# Patient Record
Sex: Male | Born: 1990 | Hispanic: Yes | Marital: Single | Smoking: Never smoker
Health system: Southern US, Community
[De-identification: ages and names within clinical notes are randomized; demographics above are authoritative.]

---
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
## 2015-07-15 ENCOUNTER — Encounter: Payer: Self-pay | Admitting: Emergency Medicine

## 2015-07-15 ENCOUNTER — Emergency Department
Admission: EM | Admit: 2015-07-15 | Discharge: 2015-07-15 | Disposition: A | Discharge status: Home / Self Care | Payer: Managed Care, Other (non HMO) | Attending: Emergency Medicine | Admitting: Emergency Medicine

## 2015-07-15 DIAGNOSIS — E860 Dehydration: Secondary | ICD-10-CM | POA: Insufficient documentation

## 2015-07-15 DIAGNOSIS — R112 Nausea with vomiting, unspecified: Secondary | ICD-10-CM | POA: Insufficient documentation

## 2015-07-15 LAB — COMPREHENSIVE METABOLIC PANEL
ALT: 25 U/L (ref 17–63)
ANION GAP: 10 (ref 5–15)
AST: 27 U/L (ref 15–41)
Albumin: 4.2 g/dL (ref 3.5–5.0)
Alkaline Phosphatase: 67 U/L (ref 38–126)
BILIRUBIN TOTAL: 1.6 mg/dL — AB (ref 0.3–1.2)
BUN: 14 mg/dL (ref 6–20)
CO2: 24 mmol/L (ref 22–32)
Calcium: 8.7 mg/dL — ABNORMAL LOW (ref 8.9–10.3)
Chloride: 104 mmol/L (ref 101–111)
Creatinine, Ser: 1.05 mg/dL (ref 0.61–1.24)
GFR calc Af Amer: >60 mL/min (ref >60)
Glucose, Bld: 127 mg/dL — ABNORMAL HIGH (ref 65–99)
POTASSIUM: 3.7 mmol/L (ref 3.5–5.1)
Sodium: 138 mmol/L (ref 135–145)
TOTAL PROTEIN: 7.9 g/dL (ref 6.5–8.1)

## 2015-07-15 LAB — CBC WITH DIFFERENTIAL/PLATELET
Basophils Absolute: 0 10*3/uL (ref 0–0.1)
Basophils Relative: 0 %
Eosinophils Absolute: 0 10*3/uL (ref 0–0.7)
Eosinophils Relative: 0 %
HEMATOCRIT: 40.2 % (ref 40.0–52.0)
Hemoglobin: 14.1 g/dL (ref 13.0–18.0)
LYMPHS ABS: 0.4 10*3/uL — AB (ref 1.0–3.6)
LYMPHS PCT: 5 %
MCH: 29.8 pg (ref 26.0–34.0)
MCHC: 35.1 g/dL (ref 32.0–36.0)
MCV: 84.7 fL (ref 80.0–100.0)
MONO ABS: 0.8 10*3/uL (ref 0.2–1.0)
MONOS PCT: 10 %
NEUTROS ABS: 6.3 10*3/uL (ref 1.4–6.5)
Neutrophils Relative %: 85 %
Platelets: 140 10*3/uL — ABNORMAL LOW (ref 150–440)
RBC: 4.74 MIL/uL (ref 4.40–5.90)
RDW: 12.4 % (ref 11.5–14.5)
WBC: 7.5 10*3/uL (ref 3.8–10.6)

## 2015-07-15 LAB — LIPASE, BLOOD: LIPASE: 24 U/L (ref 11–51)

## 2015-07-15 LAB — URINALYSIS COMPLETE WITH MICROSCOPIC (ARMC ONLY)
Bacteria, UA: NONE SEEN
Bilirubin Urine: NEGATIVE
GLUCOSE, UA: NEGATIVE mg/dL
Hgb urine dipstick: NEGATIVE
Ketones, ur: NEGATIVE mg/dL
LEUKOCYTES UA: NEGATIVE
Nitrite: NEGATIVE
Protein, ur: 30 mg/dL — AB
SPECIFIC GRAVITY, URINE: 1.035 — AB (ref 1.005–1.030)
pH: 5 (ref 5.0–8.0)

## 2015-07-15 MED ORDER — ONDANSETRON HCL 4 MG/2ML IJ SOLN
4.0000 mg | Freq: Once | INTRAMUSCULAR | Status: AC
  Administered 2015-07-15: 4 mg via INTRAVENOUS
  Filled 2015-07-15: qty 2

## 2015-07-15 MED ORDER — PROMETHAZINE HCL 25 MG PO TABS
25.0000 mg | ORAL_TABLET | Freq: Once | ORAL | Status: AC
  Administered 2015-07-15: 25 mg via ORAL
  Filled 2015-07-15: qty 1

## 2015-07-15 MED ORDER — SODIUM CHLORIDE 0.9 % IV SOLN
Freq: Once | INTRAVENOUS | Status: AC
  Administered 2015-07-15: 10:00:00 via INTRAVENOUS

## 2015-07-15 MED ORDER — SODIUM CHLORIDE 0.9 % IV SOLN
1000.0000 mL | Freq: Once | INTRAVENOUS | Status: AC
  Administered 2015-07-15: 1000 mL via INTRAVENOUS

## 2015-07-15 MED ORDER — PROMETHAZINE HCL 25 MG PO TABS: 25.0000 mg | ORAL_TABLET | Freq: Four times a day (QID) | ORAL | Status: DC | PRN

## 2015-07-15 NOTE — ED Notes (Signed)
Discussed discharge instructions, prescriptions, and follow-up care with patient. No questions or concerns at this time. Pt stable at discharge.  

## 2015-07-15 NOTE — ED Provider Notes (Signed)
[facility] Emergency Department Provider Note     Time seen: ----------------------------------------- 9:47 AM on 07/15/2015 -----------------------------------------    I have reviewed the triage vital signs and the nursing notes.   HISTORY  Chief Complaint Emesis    HPI Dustin Mccann is a 25 y.o. male who presents to the ER for nausea vomiting and weakness for the last 3 days. Patient states he hasn't been able to keep anything down since yesterday, denies any diarrhea. Patient still having profuse vomiting, feels like he may pass out. Nothing has made his symptoms better. Patient is not had this problem before.   History reviewed. No pertinent past medical history.  There are no active problems to display for this patient.   History reviewed. No pertinent past surgical history.  Allergies Review of patient's allergies indicates no known allergies.  Social History Social History  Substance Use Topics  . Smoking status: Never Smoker   . Smokeless tobacco: None  . Alcohol Use: None    Review of Systems Constitutional: Negative for fever. Eyes: Negative for visual changes. ENT: Negative for sore throat. Cardiovascular: Negative for chest pain. Respiratory: Negative for shortness of breath. Gastrointestinal: Positive for nausea and vomiting, negative for abdominal pain Genitourinary: Negative for dysuria. Musculoskeletal: Negative for back pain. Skin: Negative for rash. Neurological: Negative for headaches, positive for weakness  10-point ROS otherwise negative.  ____________________________________________   PHYSICAL EXAM:  VITAL SIGNS: ED Triage Vitals  Enc Vitals Group     BP 07/15/15 0935 100/39 mmHg     Pulse Rate 07/15/15 0935 135     Resp 07/15/15 0935 20     Temp 07/15/15 0935 98.9 F (37.2 C)     Temp src --      SpO2 07/15/15 0935 97 %     Weight 07/15/15 0935 300 lb (136.079 kg)     Height 07/15/15 0935 5'  8" (1.727 m)     Head Cir --      Peak Flow --      Pain Score --      Pain Loc --      Pain Edu? --      Excl. in GC? --     Constitutional: Alert and oriented. Mild distress Eyes: Conjunctivae are normal. PERRL. Normal extraocular movements. ENT   Head: Normocephalic and atraumatic.   Nose: No congestion/rhinnorhea.   Mouth/Throat: Mucous membranes are moist.   Neck: No stridor. Cardiovascular: Normal rate, regular rhythm. Normal and symmetric distal pulses are present in all extremities. No murmurs, rubs, or gallops. Respiratory: Normal respiratory effort without tachypnea nor retractions. Breath sounds are clear and equal bilaterally. No wheezes/rales/rhonchi. Gastrointestinal: Soft and nontender. No distention. No abdominal bruits.  Musculoskeletal: Nontender with normal range of motion in all extremities. No joint effusions.  No lower extremity tenderness nor edema. Neurologic:  Normal speech and language. No gross focal neurologic deficits are appreciated.  Skin:  Skin is warm, dry and intact. No rash noted. Psychiatric: Mood and affect are normal. Speech and behavior are normal. Patient exhibits appropriate insight and judgment. ____________________________________________  ED COURSE:  Pertinent labs & imaging results that were available during my care of the patient were reviewed by me and considered in my medical decision making (see chart for details). Patient likely with Norovirus and dehydration. He will receive 2 L of saline and IV antiemetics ____________________________________________    LABS (pertinent positives/negatives)  Labs Reviewed  CBC WITH DIFFERENTIAL/PLATELET - Abnormal; Notable for the following:  Platelets 140 (*)    Lymphs Abs 0.4 (*)    All other components within normal limits  COMPREHENSIVE METABOLIC PANEL - Abnormal; Notable for the following:    Glucose, Bld 127 (*)    Calcium 8.7 (*)    Total Bilirubin 1.6 (*)    All other  components within normal limits  URINALYSIS COMPLETEWITH MICROSCOPIC ([facility] ONLY) - Abnormal; Notable for the following:    Color, Urine YELLOW (*)    APPearance CLEAR (*)    Specific Gravity, Urine 1.035 (*)    Protein, ur 30 (*)    Squamous Epithelial / LPF 0-5 (*)    All other components within normal limits  LIPASE, BLOOD   ____________________________________________  FINAL ASSESSMENT AND PLAN  Vomiting, dehydration  Plan: Patient with labs as dictated above. Patient was able to tolerate liquids by mouth here. He can continue oral hydration at home. He'll be discharged with Zofran. Labs are unremarkable.   Emily Filbert, MD   Emily Filbert, MD 07/15/15 [phone]

## 2015-07-15 NOTE — ED Notes (Signed)
Report given to Alicia, RN

## 2015-07-15 NOTE — ED Notes (Signed)
N/v and weakness x 3 days.  Pt pale on arrival. Transported to room 15.

## 2015-07-15 NOTE — Discharge Instructions (Signed)
Nuseas y Vmitos (Nausea and Vomiting) La nusea es la sensacin de Tree surgeon en el estmago o de la necesidad de vomitar. El vmito es un reflejo por el que los contenidos del estmago salen por la boca. El vmito puede ocasionar prdida de lquidos del organismo (deshidratacin). Los nios y los [employer] pueden deshidratarse rpidamente (en especial si tambin tienen diarrea). Las nuseas y los vmitos son sntoma de un trastorno o enfermedad. Es importante Energy manager causa de los sntomas. CAUSAS  Irritacin directa de la membrana que cubre el Carmine. Esta irritacin puede ser resultado del aumento de la produccin de cido, (reflujo gastroesofgico), infecciones, intoxicacin alimentaria, ciertos medicamentos (como antinflamatorios no esteroideos), consumo de alcohol o de tabaco.  Seales del cerebro.Estas seales pueden ser un dolor de cabeza, exposicin al calor, trastornos del odo interno, aumento de la presin en el cerebro por lesiones, infeccin, un tumor o conmocin cerebral, estmulos emocionales o problemas metablicos.  Una obstruccin en el tracto gastrointestinal (obstruccin intestinal).  Ciertas enfermedades como la diabetes, problemas en la vescula biliar, apendicitis, problemas renales, cncer, sepsis, sntomas atpicos de infarto o trastornos alimentarios.  Tratamientos mdicos como la quimioterapia y la radiacin.  Medicamentos que inducen al sueo (anestesia general) durante Clementeen Hoof. DIAGNSTICO  El mdico podr solicitarle algunos anlisis si los problemas no mejoran luego de algunos das. Tambin podrn pedirle anlisis si los sntomas son graves o si el motivo de los vmitos o las nuseas no est claro. Los [employer] ser:   Anlisis de [place].  Anlisis de [place].  Pruebas de materia fecal.  Cultivos (para buscar evidencias de infeccin).  Radiografas u otros estudios por imgenes. Los [employer] de las pruebas lo ayudarn al mdico a  tomar decisiones acerca del mejor curso de tratamiento o la necesidad de [employer].  TRATAMIENTO  Debe estar bien hidratado. Beba con frecuencia pequeas cantidades de lquido.Puede beber agua, bebidas deportivas, caldos claros o comer pequeos trocitos de hielo o gelatina para mantenerse hidratado.Cuando coma, hgalo lentamente para evitar las nuseas.Hay medicamentos para evitar las nuseas que pueden aliviarlo.  INSTRUCCIONES PARA EL CUIDADO DOMICILIARIO  Si su mdico le prescribe medicamentos tmelos como se le haya indicado.  Si no tiene hambre, no se fuerce a comer. Sin embargo, es necesario que tome lquidos.  Si tiene hambre alimntese con una dieta normal, a menos que el mdico le indique otra cosa.  Los mejores alimentos son Ardelia Mems combinacin de carbohidratos complejos (arroz, trigo, papas, pan), carnes magras, yogur, frutas y Photographer.  Evite los alimentos ricos en grasas porque dificultan la digestin.  Beba gran cantidad de lquido para mantener la orina de tono claro o color amarillo plido.  Si est deshidratado, consulte a su mdico para que le d instrucciones especficas para volver a hidratarlo. Los signos de deshidratacin son:  Doristine Section sed.  Labios y boca secos.  Mareos.  Elmon Else.  Disminucin de la frecuencia y cantidad de la [place].  Confusin.  Tiene el pulso o la respiracin acelerados. SOLICITE ATENCIN MDICA DE INMEDIATO SI:  Vomita sangre o algo similar a la borra del caf.  La materia fecal (heces) es negra o tiene [address].  Sufre una cefalea grave o rigidez en el cuello.  Se siente confundido.  Siente dolor abdominal intenso.  Tiene dolor en el pecho o dificultad para respirar.  No orina por 8 horas.  Tiene la piel fra y pegajosa.  Sigue vomitando durante ms de 24 a 48 horas.  Tiene fiebre. ASEGRESE QUE:   Comprende  estas instrucciones.  Controlar su enfermedad.  Solicitar ayuda inmediatamente si no mejora o  si empeora.   Esta informacin no tiene Theme park manager el consejo del mdico. Asegrese de hacerle al mdico cualquier pregunta que tenga.   Document Released: 06/02/2007 Document Revised: 08/05/2011 Elsevier Interactive Patient Education 2016 [employer].  Deshidratacin en los adultos (Dehydration, Adult) La deshidratacin significa que el organismo no tiene todo el lquido o el agua que necesita. Se produce cuando se toma menos lquido del que se pierde. Los riones, el cerebro y el corazn no funcionarn correctamente sin la cantidad [place] de lquido.  La deshidratacin puede ser leve o grave. Debe tratarse de inmediato para evitar que se agrave. CUIDADOS EN EL HOGAR  Beba suficiente lquido para mantener el pis (orina) claro o de color amarillo plido.  Beba lentamente pequeos sorbos de agua o de lquido. Tambin puede chupar cubos de hielo.  Consuma alimentos o bebidas que contengan electrolitos. Como por ejemplo, bananas y bebidas deportivas.  Tome los medicamentos de venta libre y los recetados solamente como se lo haya indicado el mdico.  Prepare la solucin de rehidratacin oral (SRO) de acuerdo con las indicaciones del producto. Tome sorbos de la SRO cada hasta que la orina se normalice.  Si vomita o la materia fecal es lquida (diarrea), siga intentando tomar agua, SRO o las [employer].  Si la materia fecal es lquida, evite lo siguiente:  Las bebidas con cafena.  El jugo de frutas.  [employer].  Las bebidas gaseosas.  No tome comprimidos de sal. Esto puede aumentar la concentracin de sodio en el organismo (hipernatremia). SOLICITE AYUDA SI:  No puede comer o tomar lquido sin vomitar.  La materia fecal ha sido levemente acuosa durante ms de 24horas.  Tiene fiebre. SOLICITE AYUDA DE INMEDIATO SI:   Tiene mucha sed.  La materia fecal es [place].  No ha orinado durante 6 a 8horas o solo ha orinado una cantidad [employer].  Tiene la piel arrugada.  Est mareado, confundido o tiene ambos sntomas.   Esta informacin no tiene Theme park manager el consejo del mdico. Asegrese de hacerle al mdico cualquier pregunta que tenga.   Document Released: 06/15/2010 Document Revised: 02/01/2015 Elsevier Interactive Patient Education [employer]

## 2016-04-25 ENCOUNTER — Other Ambulatory Visit
Admission: RE | Admit: 2016-04-25 | Discharge: 2016-04-25 | Disposition: A | Discharge status: Home / Self Care | Payer: Managed Care, Other (non HMO) | Source: Ambulatory Visit | Attending: Internal Medicine | Admitting: Internal Medicine

## 2016-04-25 DIAGNOSIS — R42 Dizziness and giddiness: Secondary | ICD-10-CM | POA: Insufficient documentation

## 2016-04-25 DIAGNOSIS — B9681 Helicobacter pylori [H. pylori] as the cause of diseases classified elsewhere: Secondary | ICD-10-CM | POA: Insufficient documentation

## 2016-04-25 DIAGNOSIS — E669 Obesity, unspecified: Secondary | ICD-10-CM | POA: Insufficient documentation

## 2016-04-25 LAB — TSH: TSH: 3.231 u[IU]/mL (ref 0.350–4.500)

## 2016-04-25 LAB — CBC
HCT: 46.1 % (ref 40.0–52.0)
Hemoglobin: 15.5 g/dL (ref 13.0–18.0)
MCH: 28.8 pg (ref 26.0–34.0)
MCHC: 33.7 g/dL (ref 32.0–36.0)
MCV: 85.7 fL (ref 80.0–100.0)
Platelets: 204 10*3/uL (ref 150–440)
RBC: 5.39 MIL/uL (ref 4.40–5.90)
RDW: 12.9 % (ref 11.5–14.5)
WBC: 8.6 10*3/uL (ref 3.8–10.6)

## 2016-04-25 LAB — BASIC METABOLIC PANEL
Anion gap: 7 (ref 5–15)
BUN: 14 mg/dL (ref 6–20)
CO2: 28 mmol/L (ref 22–32)
Calcium: 9.7 mg/dL (ref 8.9–10.3)
Chloride: 103 mmol/L (ref 101–111)
Creatinine, Ser: 0.99 mg/dL (ref 0.61–1.24)
GFR calc Af Amer: >60 mL/min (ref >60)
GFR calc non Af Amer: >60 mL/min (ref >60)
Glucose, Bld: 92 mg/dL (ref 65–99)
Potassium: 4.1 mmol/L (ref 3.5–5.1)
Sodium: 138 mmol/L (ref 135–145)

## 2016-04-25 LAB — LIPID PANEL
Cholesterol: 226 mg/dL — ABNORMAL HIGH (ref 0–200)
HDL: 38 mg/dL — ABNORMAL LOW (ref >40)
LDL Cholesterol: 157 mg/dL — ABNORMAL HIGH (ref 0–99)
Total CHOL/HDL Ratio: 5.9 RATIO
Triglycerides: 154 mg/dL — ABNORMAL HIGH (ref <150)
VLDL: 31 mg/dL (ref 0–40)

## 2016-04-26 LAB — H PYLORI, IGM, IGG, IGA AB
H Pylori IgG: 4.7 U/mL — ABNORMAL HIGH (ref 0.0–0.8)
H. Pylogi, Iga Abs: <9 units (ref 0.0–8.9)
H. Pylogi, Igm Abs: <9 units (ref 0.0–8.9)

## 2018-11-12 ENCOUNTER — Emergency Department
Admission: EM | Admit: 2018-11-12 | Discharge: 2018-11-12 | Disposition: A | Discharge status: Other Acute Inpatient Hospital | Payer: Commercial Managed Care - PPO | Attending: Emergency Medicine | Admitting: Emergency Medicine

## 2018-11-12 ENCOUNTER — Inpatient Hospital Stay (HOSPITAL_COMMUNITY)
Admission: AD | Admit: 2018-11-12 | Discharge: 2018-11-17 | DRG: 177 | Disposition: A | Discharge status: Home / Self Care | Payer: Commercial Managed Care - PPO | Source: Other Acute Inpatient Hospital | Attending: Family Medicine | Admitting: Family Medicine

## 2018-11-12 ENCOUNTER — Inpatient Hospital Stay (HOSPITAL_COMMUNITY): Payer: Commercial Managed Care - PPO

## 2018-11-12 ENCOUNTER — Other Ambulatory Visit: Payer: Self-pay

## 2018-11-12 ENCOUNTER — Encounter: Payer: Self-pay | Admitting: Emergency Medicine

## 2018-11-12 ENCOUNTER — Emergency Department: Payer: Commercial Managed Care - PPO

## 2018-11-12 ENCOUNTER — Encounter (HOSPITAL_COMMUNITY): Payer: Self-pay | Admitting: Family Medicine

## 2018-11-12 DIAGNOSIS — E669 Obesity, unspecified: Secondary | ICD-10-CM | POA: Diagnosis not present

## 2018-11-12 DIAGNOSIS — E876 Hypokalemia: Secondary | ICD-10-CM | POA: Diagnosis present

## 2018-11-12 DIAGNOSIS — J9601 Acute respiratory failure with hypoxia: Secondary | ICD-10-CM | POA: Diagnosis present

## 2018-11-12 DIAGNOSIS — Z6841 Body Mass Index (BMI) 40.0 and over, adult: Secondary | ICD-10-CM

## 2018-11-12 DIAGNOSIS — J1289 Other viral pneumonia: Secondary | ICD-10-CM | POA: Diagnosis present

## 2018-11-12 DIAGNOSIS — Z833 Family history of diabetes mellitus: Secondary | ICD-10-CM

## 2018-11-12 DIAGNOSIS — J1282 Pneumonia due to coronavirus disease 2019: Secondary | ICD-10-CM | POA: Diagnosis present

## 2018-11-12 DIAGNOSIS — R740 Nonspecific elevation of levels of transaminase and lactic acid dehydrogenase [LDH]: Secondary | ICD-10-CM

## 2018-11-12 DIAGNOSIS — U071 COVID-19: Secondary | ICD-10-CM | POA: Diagnosis present

## 2018-11-12 DIAGNOSIS — R0602 Shortness of breath: Secondary | ICD-10-CM | POA: Diagnosis present

## 2018-11-12 LAB — CBC WITH DIFFERENTIAL/PLATELET
Abs Immature Granulocytes: 0.02 10*3/uL (ref 0.00–0.07)
Basophils Absolute: 0 10*3/uL (ref 0.0–0.1)
Basophils Relative: 0 %
Eosinophils Absolute: 0 10*3/uL (ref 0.0–0.5)
Eosinophils Relative: 0 %
HCT: 44.4 % (ref 39.0–52.0)
Hemoglobin: 15.3 g/dL (ref 13.0–17.0)
Immature Granulocytes: 1 %
Lymphocytes Relative: 12 %
Lymphs Abs: 0.5 10*3/uL — ABNORMAL LOW (ref 0.7–4.0)
MCH: 28.5 pg (ref 26.0–34.0)
MCHC: 34.5 g/dL (ref 30.0–36.0)
MCV: 82.7 fL (ref 80.0–100.0)
Monocytes Absolute: 0.4 10*3/uL (ref 0.1–1.0)
Monocytes Relative: 9 %
Neutro Abs: 3.5 10*3/uL (ref 1.7–7.7)
Neutrophils Relative %: 78 %
Platelets: 145 10*3/uL — ABNORMAL LOW (ref 150–400)
RBC: 5.37 MIL/uL (ref 4.22–5.81)
RDW: 12 % (ref 11.5–15.5)
WBC: 4.4 10*3/uL (ref 4.0–10.5)
nRBC: 0 % (ref 0.0–0.2)

## 2018-11-12 LAB — SARS CORONAVIRUS 2 BY RT PCR (HOSPITAL ORDER, PERFORMED IN ~~LOC~~ HOSPITAL LAB): SARS Coronavirus 2: POSITIVE — AB

## 2018-11-12 LAB — COMPREHENSIVE METABOLIC PANEL
ALT: 54 U/L — ABNORMAL HIGH (ref 0–44)
AST: 52 U/L — ABNORMAL HIGH (ref 15–41)
Albumin: 3.7 g/dL (ref 3.5–5.0)
Alkaline Phosphatase: 55 U/L (ref 38–126)
Anion gap: 14 (ref 5–15)
BUN: 13 mg/dL (ref 6–20)
CO2: 21 mmol/L — ABNORMAL LOW (ref 22–32)
Calcium: 8.6 mg/dL — ABNORMAL LOW (ref 8.9–10.3)
Chloride: 100 mmol/L (ref 98–111)
Creatinine, Ser: 0.81 mg/dL (ref 0.61–1.24)
GFR calc Af Amer: >60 mL/min (ref >60)
GFR calc non Af Amer: >60 mL/min (ref >60)
Glucose, Bld: 146 mg/dL — ABNORMAL HIGH (ref 70–99)
Potassium: 3.2 mmol/L — ABNORMAL LOW (ref 3.5–5.1)
Sodium: 135 mmol/L (ref 135–145)
Total Bilirubin: 1 mg/dL (ref 0.3–1.2)
Total Protein: 8.3 g/dL — ABNORMAL HIGH (ref 6.5–8.1)

## 2018-11-12 LAB — PROCALCITONIN: Procalcitonin: <0.1 ng/mL

## 2018-11-12 LAB — FIBRIN DERIVATIVES D-DIMER (ARMC ONLY): Fibrin derivatives D-dimer (ARMC): 743.2 ng/mL (FEU) — ABNORMAL HIGH (ref 0.00–499.00)

## 2018-11-12 LAB — FIBRINOGEN: Fibrinogen: 660 mg/dL — ABNORMAL HIGH (ref 210–475)

## 2018-11-12 LAB — LACTIC ACID, PLASMA: Lactic Acid, Venous: 1.4 mmol/L (ref 0.5–1.9)

## 2018-11-12 LAB — C-REACTIVE PROTEIN: CRP: 9.7 mg/dL — ABNORMAL HIGH (ref <1.0)

## 2018-11-12 LAB — TRIGLYCERIDES: Triglycerides: 114 mg/dL (ref <150)

## 2018-11-12 LAB — LACTATE DEHYDROGENASE: LDH: 260 U/L — ABNORMAL HIGH (ref 98–192)

## 2018-11-12 LAB — FERRITIN: Ferritin: 738 ng/mL — ABNORMAL HIGH (ref 24–336)

## 2018-11-12 MED ORDER — KETOROLAC TROMETHAMINE 30 MG/ML IJ SOLN
INTRAMUSCULAR | Status: AC
  Filled 2018-11-12: qty 1

## 2018-11-12 MED ORDER — POTASSIUM CHLORIDE CRYS ER 20 MEQ PO TBCR
40.0000 meq | EXTENDED_RELEASE_TABLET | Freq: Once | ORAL | Status: AC
  Administered 2018-11-12: 40 meq via ORAL
  Filled 2018-11-12: qty 2

## 2018-11-12 MED ORDER — ACETAMINOPHEN 325 MG PO TABS
ORAL_TABLET | ORAL | Status: AC
  Administered 2018-11-12: 650 mg via ORAL
  Filled 2018-11-12: qty 2

## 2018-11-12 MED ORDER — LOPERAMIDE HCL 2 MG PO CAPS
2.0000 mg | ORAL_CAPSULE | Freq: Four times a day (QID) | ORAL | Status: DC | PRN
  Administered 2018-11-13 (×2): 2 mg via ORAL
  Filled 2018-11-12 (×2): qty 1

## 2018-11-12 MED ORDER — ACETAMINOPHEN 325 MG PO TABS
650.0000 mg | ORAL_TABLET | Freq: Four times a day (QID) | ORAL | Status: DC | PRN
  Administered 2018-11-12: 650 mg via ORAL
  Filled 2018-11-12: qty 2

## 2018-11-12 MED ORDER — SODIUM CHLORIDE 0.9 % IV SOLN
500.0000 mg | Freq: Once | INTRAVENOUS | Status: AC
  Administered 2018-11-12: 08:00:00 500 mg via INTRAVENOUS
  Filled 2018-11-12: qty 500

## 2018-11-12 MED ORDER — ACETAMINOPHEN 325 MG PO TABS
650.0000 mg | ORAL_TABLET | Freq: Once | ORAL | Status: AC
  Administered 2018-11-12: 08:00:00 650 mg via ORAL

## 2018-11-12 MED ORDER — MEDI-TUSSIN DM DOUBLE STRENGTH 30-200 MG/5ML PO LIQD
1000.00 | ORAL | Status: DC
Start: 2018-11-10 — End: 2018-11-12

## 2018-11-12 MED ORDER — ISOVUE-M 300 61 % IJ SOLN
4.00 | INTRAMUSCULAR | Status: DC
Start: ? — End: 2018-11-12

## 2018-11-12 MED ORDER — VITAMIN C 500 MG PO TABS
500.0000 mg | ORAL_TABLET | Freq: Every day | ORAL | Status: DC
  Administered 2018-11-12 – 2018-11-17 (×6): 500 mg via ORAL
  Filled 2018-11-12 (×6): qty 1

## 2018-11-12 MED ORDER — DEXAMETHASONE SODIUM PHOSPHATE 10 MG/ML IJ SOLN
10.0000 mg | Freq: Once | INTRAMUSCULAR | Status: AC
  Administered 2018-11-12: 07:00:00 10 mg via INTRAVENOUS
  Filled 2018-11-12: qty 1

## 2018-11-12 MED ORDER — IPRATROPIUM-ALBUTEROL 0.5-2.5 (3) MG/3ML IN SOLN
RESPIRATORY_TRACT | Status: AC
  Filled 2018-11-12: qty 3

## 2018-11-12 MED ORDER — ZINC SULFATE 220 (50 ZN) MG PO CAPS
220.0000 mg | ORAL_CAPSULE | Freq: Every day | ORAL | Status: DC
  Administered 2018-11-12 – 2018-11-17 (×6): 220 mg via ORAL
  Filled 2018-11-12 (×6): qty 1

## 2018-11-12 MED ORDER — SELECT BRAND INSULIN SYRINGE 29G X 1/2" 1 ML MISC
1.00 | Status: DC
Start: 2018-11-11 — End: 2018-11-12

## 2018-11-12 MED ORDER — DEXAMETHASONE 6 MG PO TABS
6.0000 mg | ORAL_TABLET | Freq: Every day | ORAL | Status: DC
  Administered 2018-11-13 – 2018-11-17 (×5): 6 mg via ORAL
  Filled 2018-11-12 (×5): qty 1

## 2018-11-12 MED ORDER — GUAIFENESIN-DM 100-10 MG/5ML PO SYRP
10.0000 mL | ORAL_SOLUTION | ORAL | Status: DC | PRN
  Administered 2018-11-13: 10 mL via ORAL
  Filled 2018-11-12: qty 10

## 2018-11-12 MED ORDER — ENOXAPARIN SODIUM 80 MG/0.8ML ~~LOC~~ SOLN
70.0000 mg | SUBCUTANEOUS | Status: DC
  Administered 2018-11-12 – 2018-11-16 (×5): 70 mg via SUBCUTANEOUS
  Filled 2018-11-12 (×5): qty 0.8

## 2018-11-12 MED ORDER — IOHEXOL 350 MG/ML SOLN
100.0000 mL | Freq: Once | INTRAVENOUS | Status: AC | PRN
  Administered 2018-11-12: 100 mL via INTRAVENOUS

## 2018-11-12 MED ORDER — SODIUM CHLORIDE 0.9 % IV SOLN
1.0000 g | Freq: Once | INTRAVENOUS | Status: AC
  Administered 2018-11-12: 08:00:00 1 g via INTRAVENOUS
  Filled 2018-11-12: qty 10

## 2018-11-12 MED ORDER — KETOROLAC TROMETHAMINE 30 MG/ML IJ SOLN
15.0000 mg | Freq: Once | INTRAMUSCULAR | Status: AC
  Administered 2018-11-12: 15 mg via INTRAVENOUS

## 2018-11-12 NOTE — ED Provider Notes (Signed)
Spoke with patient regarding test results in the recommendation to be transferred to our [place] [facility] [place] care, patient has agreed to this.  Discussed with Dr. Loleta Books who has accepted the patient in transfer   Lavonia Drafts, MD 11/12/18 [phone]

## 2018-11-12 NOTE — Plan of Care (Signed)

## 2018-11-12 NOTE — ED Triage Notes (Signed)
Patient ambulatory to triage with steady gait, without difficulty or distress noted, mask in place; pt reports +COVID on Monday at [facility]; having increased SHOB, mid CP and prod cough bloody sputum; pt taken immed to room 6 via w/c by EDT Alma Friendly for further eval; charge nurse notified

## 2018-11-12 NOTE — Progress Notes (Signed)
Dr. Curly Rim because patient asking for medication for diarrhea. See new orders.

## 2018-11-12 NOTE — ED Notes (Signed)
Pt states he was at [facility] for one night and then discharged. Pt says tonight he was unable to sleep due to worsening SOB. Pt respiratory rate is 33. No cough heard. Pt states he does not feel good. Dr Owens Shark at the bedside.

## 2018-11-12 NOTE — H&P (Signed)
History and Physical  Patient Name: Dustin Mccann     SWN:[id number]    DOB: 23-Mar-1991    DOA: 11/12/2018 PCP: [facility]  Patient coming from: [facility] ER  Chief Complaint: Dyspnea with exertion      HPI: Dustin Mccann is a 28 y.o. M with hx obesity who presents with 1 week cough, fever, now progressive dyspnea on exertion, dizziness, hemoptysis.  The patient was at usual state of health until 6/9 when he developed cough, fever, body aches.  His mother had developed symptoms around the same time.  On Mon 3 days PTA, he went to [facility] ER where he tested positive for SARS-CoV-2, had developing pneumonia on chest x-ray, but was stable on room air and was discharged the next day.    Over the next 3 days, he is started to feel more dyspneic with exertion, chest discomfort, and dizziness with ambulation.  He has had continued fevers and cough productive of once bloody sputum.   At [facility], he had bilateral pneumonia on chest x-ray, was hypoxic to 87% on room air.  Had lymphopenia and transaminitis.  Had newly elevated d-dimer.  Lactate, renal function, procalcitonin all normal.  He was given Decadron and transferred to [facility].        ROS: Review of Systems  Constitutional: Positive for chills, fever and malaise/fatigue.  Respiratory: Positive for cough, hemoptysis and shortness of breath. Negative for sputum production and wheezing.   Cardiovascular: Positive for chest pain. Negative for palpitations, orthopnea, claudication, leg swelling and PND.  Gastrointestinal: Positive for diarrhea. Negative for abdominal pain, nausea and vomiting.  Musculoskeletal: Positive for myalgias.  All other systems reviewed and are negative.       Allergies: @ALLERGY @  Home medications: None  Past medical history: Obesity  Past surgical history: Buttock abscess I&D  Family history:  Grandfather, diabetes  Social History:  Patient lives with  mother, siblings.  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  Non-smoker.  Minimal alcohol.      Physical Exam: BP 119/76 (BP Location: Left Arm)   Pulse (!) 103   Temp 97.8 F (36.6 C) (Oral)   SpO2 96%  General appearance: Well-developed, adult male, alert and in mild respiratory distress.   Eyes: Anicteric, conjunctiva pink, lids and lashes normal. PERRL.    ENT: No nasal deformity, discharge, epistaxis.  Hearing normal. OP moist without lesions.   Skin: Warm and dry.  No jaundice.  No suspicious rashes or lesions. Cardiac: RRR, nl S1-S2, no murmurs appreciated.  Capillary refill is brisk.  JVP not visible.  No LE edema.  Radial pulses 2+ and symmetric. Respiratory: Tachypneic, shallow, lung sounds diminished bilaterally, no wheezes. Abdomen: Abdomen soft.  No TTP or guarding. No ascites, distension, hepatosplenomegaly.   MSK: No deformities or effusions of the large joints of the upper or lower extremities bilaterally.  No cyanosis or clubbing. Neuro: Cranial nerves 3 through 12 intact.  Sensation intact to light touch. Speech is fluent.  Muscle strength symmetric bilateral upper and lower extremities.    Psych: Sensorium intact and responding to questions, attention normal.  Behavior appropriate.  Affect normal.  Judgment and insight appear normal.       Labs on Admission:  The metabolic panel personally reviewed shows mild transaminitis, hypokalemia, normal renal function. The complete blood count shows lymphopenia, mild thrombocytopenia. Hepatic panel shows mild transaminitis. Ferritin level elevated, d-dimer elevated.  Radiological Exams on Admission: Personally reviewed chest x-ray  shows bilateral opacities.         Assessment/Plan  Acute hypoxic respiratory failure COVID-19 New, severe.  - Continue Decadron - Lovenox for VTE prophylaxis - Zinc and vitamin C - Obtain CTA chest to rule out PE  -Trend d-dimer, ferritin, CRP -If O2 needs  normalize by tomorrow, likely home within 2-3 days, otherwise will start remdesivir    Hypokalemia -Supplement K -Trend BMP  Transaminitis New -Check hepatitis serologies -Check HIV -Trend LFTs   DVT prophylaxis: Loveno  Code Status: FULL  Family Communication: None present Disposition Plan: Anticipate close monitoring of O2 needs overnight.  If O2 needs persistent or worsening, will start remdesivir Consults called: None Admission status: INPATIENT   At the time of admission, it appears that the appropriate admission status for this patient is INPATIENT. This is judged to be reasonable and necessary in order to provide the required intensity of service to ensure the patient's safety given: -presenting symptoms of dyspnea, dizziness, chest discomfort, near syncope -physical exam findings of tachypnea, diminished lung sounds, tachycardia, and  -initial radiographic and laboratory data bilateral peumonia, SARS-CoV-2 infection, elevated d-dimer, elevated ferritin, lymphopenia, transaminitis -in the context of their chronic comorbidities obesity    Together, these circumstances are felt to place him at high risk for further clinical deterioration threatening life, limb, or organ requiring a high intensity of service due to this acute illness that poses a threat to life, limb or bodily function.  I certify that at the point of admission it is my clinical judgment that the patient will require inpatient hospital care spanning beyond 2 midnights from the point of admission and that early discharge would result in unnecessary risk of decompensation and readmission or threat to life, limb or bodily function.      Medical decision making: Patient seen at 6:09 PM on 11/12/2018.  The patient was discussed with Dr. Cyril LoosenKinner.  What exists of the patient's chart was reviewed in depth and summarized above.  Clinical condition: requiring 3L supplemental O2, appears out of breath but hemodynamically  stable.      Earl Liteshristopher P Ignace Mandigo [facility] Pager: please page via [email or web address]

## 2018-11-12 NOTE — ED Notes (Signed)
Handoff given to [employer]

## 2018-11-12 NOTE — ED Notes (Signed)
[facility]  CALLED  FOR  TRANSFER 

## 2018-11-12 NOTE — ED Notes (Signed)
Pt placed on 2L of O2 via nasal cannula. 

## 2018-11-12 NOTE — ED Provider Notes (Signed)
[facility] Emergency Department Provider Note _______   First MD Initiated Contact with Patient 11/12/18 [phone]     (approximate)  I have reviewed the triage vital signs and the nursing notes.   HISTORY  Chief Complaint Shortness of Breath    HPI Dustin CanterDaniel Sosa Mccann is a 28 y.o. male with below list of previous medical additions including obesity and recently diagnosed COVID-19 infection at [facility] on Monday which he was hospitalized for 1 day per patient presents to the emergency department secondary to increasing dyspnea mid chest discomfort and productive cough with hemoptysis.  Patient also admits to fever and chills.  Past medical history COVID-19 infection        Patient Active Problem List   Diagnosis Date Noted   COVID-19 virus detected 11/12/2018   Acute respiratory failure with hypoxemia (HCC) 11/12/2018   Pneumonia due to COVID-19 virus 11/12/2018    History reviewed. No pertinent surgical history.  Prior to Admission medications   Not on File    Allergies No known drug allergies Family History  Problem Relation Age of Onset   Diabetes Maternal Grandfather     Social History Social History   Tobacco Use   Smoking status: Never Smoker   Smokeless tobacco: Never Used  Substance Use Topics   Alcohol use: Not Currently   Drug use: Never    Review of Systems Constitutional: Positive for fever and chills Eyes: No visual changes. ENT: No sore throat. Cardiovascular: Denies chest pain. Respiratory: Positive for dyspnea scant hemoptysis and cough Gastrointestinal: No abdominal pain.  No nausea, no vomiting.  No diarrhea.  No constipation. Genitourinary: Negative for dysuria. Musculoskeletal: Negative for neck pain.  Negative for back pain. Integumentary: Negative for rash. Neurological: Negative for headaches, focal weakness or numbness.   ____________________________________________   PHYSICAL EXAM:  VITAL  SIGNS: ED Triage Vitals  Enc Vitals Group     BP 11/12/18 0600 103/70     Pulse Rate 11/12/18 0600 (!) 124     Resp 11/12/18 0600 (!) 24     Temp --      Temp src --      SpO2 11/12/18 0600 94 %     Weight 11/12/18 0548 (!) 141.5 kg (312 lb)     Height 11/12/18 0548 1.727 m (5\' 8" )     Head Circumference --      Peak Flow --      Pain Score 11/12/18 0547 10     Pain Loc --      Pain Edu? --      Excl. in GC? --     Constitutional: Alert and oriented.  Ill-appearing. Eyes: Conjunctivae are normal.  Mouth/Throat: Mucous membranes are moist.  Oropharynx non-erythematous. Neck: No stridor.   Cardiovascular: Tachycardia, regular rhythm. Good peripheral circulation. Grossly normal heart sounds. Respiratory: Tachypnea, diffuse rhonchi Gastrointestinal: Soft and nontender. No distention.  Musculoskeletal: No lower extremity tenderness nor edema. No gross deformities of extremities. Neurologic:  Normal speech and language. No gross focal neurologic deficits are appreciated.  Skin:  Skin is warm, dry and intact. No rash noted. Psychiatric: Mood and affect are normal. Speech and behavior are normal.  ____________________________________________   LABS (all labs ordered are listed, but only abnormal results are displayed)  Labs Reviewed  SARS CORONAVIRUS 2 (HOSPITAL ORDER, PERFORMED IN [facility]) - Abnormal; Notable for the following components:      Result Value   SARS Coronavirus 2 POSITIVE (*)  All other components within normal limits  CBC WITH DIFFERENTIAL/PLATELET - Abnormal; Notable for the following components:   Platelets 145 (*)    Lymphs Abs 0.5 (*)    All other components within normal limits  COMPREHENSIVE METABOLIC PANEL - Abnormal; Notable for the following components:   Potassium 3.2 (*)    CO2 21 (*)    Glucose, Bld 146 (*)    Calcium 8.6 (*)    Total Protein 8.3 (*)    AST 52 (*)    ALT 54 (*)    All other components within normal limits    FIBRIN DERIVATIVES D-DIMER ([facility] ONLY) - Abnormal; Notable for the following components:   Fibrin derivatives D-dimer ([facility]) 743.20 (*)    All other components within normal limits  LACTATE DEHYDROGENASE - Abnormal; Notable for the following components:   LDH 260 (*)    All other components within normal limits  FERRITIN - Abnormal; Notable for the following components:   Ferritin 738 (*)    All other components within normal limits  FIBRINOGEN - Abnormal; Notable for the following components:   Fibrinogen 660 (*)    All other components within normal limits  C-REACTIVE PROTEIN - Abnormal; Notable for the following components:   CRP 9.7 (*)    All other components within normal limits  CULTURE, BLOOD (ROUTINE X 2)  CULTURE, BLOOD (ROUTINE X 2)  LACTIC ACID, PLASMA  PROCALCITONIN  TRIGLYCERIDES   ____________________________________________  EKG  ED ECG REPORT I, [place] N Lary Eckardt, the attending physician, personally viewed and interpreted this ECG.   Date: 11/12/2018  EKG Time: 6:00 AM  Rate: 126  Rhythm: Sinus tachycardia  Axis: Rightward axis deviation.  Intervals: Normal  ST&T Change: None  ____________________________________________  RADIOLOGY I, [place] N Haskel Dewalt, personally viewed and evaluated these images (plain radiographs) as part of my medical decision making, as well as reviewing the written report by the radiologist.  ED MD interpretation: Multifocal pneumonia noted on chest x-ray  Official radiology report(s): Dg Chest Port 1 View  Result Date: 11/12/2018 CLINICAL DATA:  Shortness of breath, COVID positive EXAM: PORTABLE CHEST 1 VIEW COMPARISON:  None. FINDINGS: Multifocal patchy opacities in the bilateral upper lobes and left lower lobe, compatible with multifocal pneumonia. No pleural effusion or pneumothorax. Mild eventration of the right hemidiaphragm. The heart is normal in size. IMPRESSION: Multifocal pneumonia, as above. Electronically Signed   By:  Julian Hy M.D.   On: 11/12/2018 07:02    ____________________________________________   PROCEDURES   Procedure(s) performed (including Critical Care):  .Critical Care Performed by: Gregor Hams, MD Authorized by: Gregor Hams, MD   Critical care provider statement:    Critical care time (minutes):  45   Critical care time was exclusive of:  Separately billable procedures and treating other patients   Critical care was necessary to treat or prevent imminent or life-threatening deterioration of the following conditions:  Respiratory failure   Critical care was time spent personally by me on the following activities:  Development of treatment plan with patient or surrogate, discussions with consultants, evaluation of patient's response to treatment, examination of patient, obtaining history from patient or surrogate, ordering and performing treatments and interventions, ordering and review of laboratory studies, ordering and review of radiographic studies, pulse oximetry, re-evaluation of patient's condition and review of old charts     ____________________________________________   [place] / MDM / [place] / ED COURSE  As part of my medical decision making,  I reviewed the following data within the electronic MEDICAL RECORD NUMBER   28 year old male presenting with above-stated history and physical exam secondary to recently diagnosed COVID infection with progressive dyspnea hypoxia with oxygen requirement at present.  Patient placed on 2 L nasal cannula with improvement however patient remains tachypneic.  Patient's chest x-ray consistent with multifocal pneumonia and as such ceftriaxone and azithromycin IV administered.  Anticipate admission for the patient.  Patient's care transferred to Dr. Cyril LoosenKinner  *Dustin Canteraniel Sosa Mccann was evaluated in Emergency Department on 11/12/2018 for the symptoms described in the history of present illness. He was evaluated in  the context of the global COVID-19 pandemic, which necessitated consideration that the patient might be at risk for infection with the SARS-CoV-2 virus that causes COVID-19. Institutional protocols and algorithms that pertain to the evaluation of patients at risk for COVID-19 are in a state of rapid change based on information released by regulatory bodies including the CDC and federal and state organizations. These policies and algorithms were followed during the patient's care in the ED.  Some ED evaluations and interventions may be delayed as a result of limited staffing during the pandemic.*    ____________________________________________  FINAL CLINICAL IMPRESSION(S) / ED DIAGNOSES  Final diagnoses:  COVID-19     MEDICATIONS GIVEN DURING THIS VISIT:  Medications  dexamethasone (DECADRON) injection 10 mg (10 mg Intravenous Given 11/12/18 0728)  cefTRIAXone (ROCEPHIN) 1 g in sodium chloride 0.9 % 100 mL IVPB (0 g Intravenous Stopped 11/12/18 0812)  azithromycin (ZITHROMAX) 500 mg in sodium chloride 0.9 % 250 mL IVPB (0 mg Intravenous Stopped 11/12/18 0936)  ketorolac (TORADOL) 30 MG/ML injection 15 mg (15 mg Intravenous Given 11/12/18 0745)  acetaminophen (TYLENOL) tablet 650 mg (650 mg Oral Given 11/12/18 0809)     ED Discharge Orders    None       Note:  This document was prepared using Dragon voice recognition software and may include unintentional dictation errors.   Darci CurrentBrown, [place] N, MD 11/12/18 2322

## 2018-11-13 ENCOUNTER — Inpatient Hospital Stay (HOSPITAL_COMMUNITY): Payer: Commercial Managed Care - PPO

## 2018-11-13 LAB — COMPREHENSIVE METABOLIC PANEL
ALT: 54 U/L — ABNORMAL HIGH (ref 0–44)
AST: 42 U/L — ABNORMAL HIGH (ref 15–41)
Albumin: 3.5 g/dL (ref 3.5–5.0)
Alkaline Phosphatase: 55 U/L (ref 38–126)
Anion gap: 10 (ref 5–15)
BUN: 16 mg/dL (ref 6–20)
CO2: 25 mmol/L (ref 22–32)
Calcium: 8.8 mg/dL — ABNORMAL LOW (ref 8.9–10.3)
Chloride: 103 mmol/L (ref 98–111)
Creatinine, Ser: 0.72 mg/dL (ref 0.61–1.24)
GFR calc Af Amer: >60 mL/min (ref >60)
GFR calc non Af Amer: >60 mL/min (ref >60)
Glucose, Bld: 165 mg/dL — ABNORMAL HIGH (ref 70–99)
Potassium: 3.4 mmol/L — ABNORMAL LOW (ref 3.5–5.1)
Sodium: 138 mmol/L (ref 135–145)
Total Bilirubin: 0.3 mg/dL (ref 0.3–1.2)
Total Protein: 7.9 g/dL (ref 6.5–8.1)

## 2018-11-13 LAB — CBC WITH DIFFERENTIAL/PLATELET
Abs Immature Granulocytes: 0.04 10*3/uL (ref 0.00–0.07)
Basophils Absolute: 0 10*3/uL (ref 0.0–0.1)
Basophils Relative: 0 %
Eosinophils Absolute: 0 10*3/uL (ref 0.0–0.5)
Eosinophils Relative: 0 %
HCT: 44.4 % (ref 39.0–52.0)
Hemoglobin: 15.1 g/dL (ref 13.0–17.0)
Immature Granulocytes: 1 %
Lymphocytes Relative: 6 %
Lymphs Abs: 0.5 10*3/uL — ABNORMAL LOW (ref 0.7–4.0)
MCH: 29 pg (ref 26.0–34.0)
MCHC: 34 g/dL (ref 30.0–36.0)
MCV: 85.2 fL (ref 80.0–100.0)
Monocytes Absolute: 0.8 10*3/uL (ref 0.1–1.0)
Monocytes Relative: 9 %
Neutro Abs: 7.3 10*3/uL (ref 1.7–7.7)
Neutrophils Relative %: 84 %
Platelets: 185 10*3/uL (ref 150–400)
RBC: 5.21 MIL/uL (ref 4.22–5.81)
RDW: 12.1 % (ref 11.5–15.5)
WBC: 8.7 10*3/uL (ref 4.0–10.5)
nRBC: 0 % (ref 0.0–0.2)

## 2018-11-13 LAB — D-DIMER, QUANTITATIVE: D-Dimer, Quant: 0.41 ug/mL-FEU (ref 0.00–0.50)

## 2018-11-13 LAB — TROPONIN I: Troponin I: <0.03 ng/mL (ref <0.03)

## 2018-11-13 LAB — ABO/RH: ABO/RH(D): B POS

## 2018-11-13 LAB — FERRITIN: Ferritin: 764 ng/mL — ABNORMAL HIGH (ref 24–336)

## 2018-11-13 LAB — MAGNESIUM: Magnesium: 2.1 mg/dL (ref 1.7–2.4)

## 2018-11-13 LAB — C-REACTIVE PROTEIN: CRP: 8.7 mg/dL — ABNORMAL HIGH (ref <1.0)

## 2018-11-13 MED ORDER — POTASSIUM CHLORIDE CRYS ER 20 MEQ PO TBCR
40.0000 meq | EXTENDED_RELEASE_TABLET | Freq: Once | ORAL | Status: AC
  Administered 2018-11-13: 40 meq via ORAL
  Filled 2018-11-13: qty 2

## 2018-11-13 MED ORDER — SODIUM CHLORIDE 0.9 % IV SOLN
200.0000 mg | Freq: Once | INTRAVENOUS | Status: AC
  Administered 2018-11-13: 200 mg via INTRAVENOUS
  Filled 2018-11-13: qty 40

## 2018-11-13 MED ORDER — SODIUM CHLORIDE 0.9 % IV SOLN
100.0000 mg | INTRAVENOUS | Status: AC
  Administered 2018-11-14 – 2018-11-17 (×4): 100 mg via INTRAVENOUS
  Filled 2018-11-13 (×4): qty 20

## 2018-11-13 NOTE — Plan of Care (Signed)
Per 3rd shift report, patient SPO2 fell to upper 70s when attempted on RA.  Patient placed on 2L [place] with current SPO2 levels in the low 90s.  O2 extension placed to allow patient to ambulate with O2 intact.

## 2018-11-13 NOTE — Progress Notes (Signed)
Pharmacy Brief Note   O:  ALT: mildly elevated, stable at 54 CXR: (6/18) Multifocal pneumonia SpO2: 90% on 2L [place] (sats fell to 70% on room air per RN notes)   A/P:  Patient meets criteria for remdesivir. Will initiate remdesivir 200 mg once followed by 100 mg daily x 4 days.   Gretta Arab PharmD, BCPS Clinical Pharmacist Clinical pharmacist phone 7am- 5pm: [phone] 11/13/2018 8:49 AM

## 2018-11-13 NOTE — Plan of Care (Addendum)
Patient continues to struggle with repositioning or attempts to prone.  Any movement causes extreme coughing and Desat to mid-upper 70s on 2L.  Will continue to assess and adjust as needed.

## 2018-11-13 NOTE — Progress Notes (Signed)
PROGRESS NOTE    Dustin Mccann  ZOX:[id number] DOB: 19-Nov-1990 DOA: 11/12/2018 PCP: [place], [facility]      Brief Narrative:  Dustin Mccann is a 28 y.o. M with hx obesity who presents with 1 week cough, fever, now progressive dyspnea on exertion, dizziness, hemoptysis.  The patient was at usual state of health until 6/9 when he developed cough, fever, body aches.  His mother had developed symptoms around the same time.  On Mon 3 days PTA, [facility] where he tested positive for SARS-CoV-2, had developing pneumonia on chest x-ray, but was stable on room air and was discharged the next day.    Over the next 3 days, he is started to feel more dyspneic with exertion, chest discomfort, and dizziness with ambulation.  He has had continued fevers and cough productive of once bloody sputum.   At [facility], he had bilateral pneumonia on chest x-ray, was hypoxic to 87% on room air.  Had lymphopenia and transaminitis.  Had newly elevated d-dimer.  Lactate, renal function, procalcitonin all normal.  He was given Decadron and [facility].        Assessment & Plan:  Coronavirus pneumonitis with acute hypoxic respiratory failure In setting of ongoing 2020 COVID-19 pandemic.  Overnight O2 dropped to 70% with ambualtion.  CTA report reviewed, negative for PE, shows extensive pneumonia.  -Start remdesivir, day 1 of 5 -Continue steroids  -VTE PPx with Lovenox -Continue Zinc and Vitamin C -Daily d-dimer, ferritin and CRP  COVID-19 Labs Recent Labs    11/12/18 0601 11/13/18 0300  DDIMER  --  0.41  FERRITIN 738* 764*  LDH 260*  --   CRP 9.7* 8.7*      Hypokalemia Repleted. -Check mag -Repeat K suppl  Transaminitis No change. -Monitor closely on  -Follow viral serologies, HIV      MDM and disposition: The below labs and imaging reports were reviewed and summarized above.  Medication management as above.  The patient was  admitted with acute hypoxic respiratory failure from COVID-19.  This is an acute illness that poses a severe threat to life.              DVT prophylaxis: Lovenox Code Status: FULL Family Communication: None    Consultants:   None  Procedures:   CTA chest  Antimicrobials:   Ceftriaxone x1 6/18  Azithromycin x1 6/18   Culture data:   6/18 blood culture x2 -- NGTD       Subjective: With assistance of videophonic interpreter.  Very dyspneic with any exertion, dizzy and near syncopal with standing.  No vomiting.  Still with diarrhea.  Objective: Vitals:   11/13/18 0756 11/13/18 0915 11/13/18 1131 11/13/18 1706  BP: 117/67  113/68   Pulse: (!) 102  (!) 130   Resp: (!) 23  18   Temp: 98.8 F (37.1 C)  98.3 F (36.8 C) 98.8 F (37.1 C)  TempSrc: Oral  Oral   SpO2: 92% (!) 78% 90%     Intake/Output Summary (Last 24 hours) at 11/13/2018 1816 Last data filed at 11/13/2018 1500 Gross per 24 hour  Intake 1446.32 ml  Output --  Net 1446.32 ml   There were no vitals filed for this visit.  Examination: General appearance: obese adult male, alert and in moderate respiratory distress.  Appears uncomfortable HEENT: Anicteric, conjunctiva pink, lids and lashes normal. No nasal deformity, discharge, epistaxis.  Lips moist, dentition normal, OP  moist, no oral lesions, hearing normal.   Skin: Warm and dry.  no jaundice.  No suspicious rashes or lesions. Cardiac: Tachycardic, regular, nl S1-S2, no murmurs appreciated.  Capillary refill is brisk.  JVP not visible due to body habitus.  No LE edema.  Radial pulses 2+ and symmetric. Respiratory: Tachypneic, shallow breaths.  Lungs sounds diminished. Abdomen: Abdomen soft.  Mild nonfocal TTPwith voluntary guarding. No ascites, distension, hepatosplenomegaly.   MSK: No deformities or effusions. Neuro: Awake and alert.  EOMI, moves all extremities. Speech fluent.    Psych: Sensorium intact and responding to questions,  attention normal. Affect normal.  Judgment and insight appear normal.       Data Reviewed: I have personally reviewed following labs and imaging studies:  CBC: Recent Labs  Lab 11/12/18 0601 11/13/18 0300  WBC 4.4 8.7  NEUTROABS 3.5 7.3  HGB 15.3 15.1  HCT 44.4 44.4  MCV 82.7 85.2  PLT 145* 185   Basic Metabolic Panel: Recent Labs  Lab 11/12/18 0601 11/13/18 0300  NA 135 138  K 3.2* 3.4*  CL 100 103  CO2 21* 25  GLUCOSE 146* 165*  BUN 13 16  CREATININE 0.81 0.72  CALCIUM 8.6* 8.8*  MG  --  2.1   GFR: Estimated Creatinine Clearance: 194.9 mL/min (by C-G formula based on SCr of 0.72 mg/dL). Liver Function Tests: Recent Labs  Lab 11/12/18 0601 11/13/18 0300  AST 52* 42*  ALT 54* 54*  ALKPHOS 55 55  BILITOT 1.0 0.3  PROT 8.3* 7.9  ALBUMIN 3.7 3.5   No results for input(s): LIPASE, AMYLASE in the last 168 hours. No results for input(s): AMMONIA in the last 168 hours. Coagulation Profile: No results for input(s): INR, PROTIME in the last 168 hours. Cardiac Enzymes: Recent Labs  Lab 11/13/18 0300  TROPONINI <0.03   BNP (last 3 results) No results for input(s): PROBNP in the last 8760 hours. HbA1C: No results for input(s): HGBA1C in the last 72 hours. CBG: No results for input(s): GLUCAP in the last 168 hours. Lipid Profile: Recent Labs    11/12/18 0601  TRIG 114   Thyroid Function Tests: No results for input(s): TSH, T4TOTAL, FREET4, T3FREE, THYROIDAB in the last 72 hours. Anemia Panel: Recent Labs    11/12/18 0601 11/13/18 0300  FERRITIN 738* 764*   Urine analysis:    Component Value Date/Time   COLORURINE YELLOW (A) 07/15/2015 1115   APPEARANCEUR CLEAR (A) 07/15/2015 1115   LABSPEC 1.035 (H) 07/15/2015 1115   PHURINE 5.0 07/15/2015 1115   GLUCOSEU NEGATIVE 07/15/2015 1115   HGBUR NEGATIVE 07/15/2015 1115   BILIRUBINUR NEGATIVE 07/15/2015 1115   KETONESUR NEGATIVE 07/15/2015 1115   PROTEINUR 30 (A) 07/15/2015 1115   NITRITE  NEGATIVE 07/15/2015 1115   LEUKOCYTESUR NEGATIVE 07/15/2015 1115   Sepsis Labs: (procalcitonin:4,lacticacidven:4)  ) Recent Results (from the past 240 hour(s))  SARS Coronavirus 2 [facility] order, Performed in [facility] hospital lab)     Status: Abnormal   Collection Time: 11/12/18  6:01 AM   Specimen: Nasopharyngeal Swab  Result Value Ref Range Status   SARS Coronavirus 2 POSITIVE (A) NEGATIVE Final    Comment: RESULT CALLED TO, READ BACK BY AND VERIFIED WITH:  TERESA CLAPP AT 0732 11/12/2018 SDR (NOTE) If result is NEGATIVE SARS-CoV-2 target nucleic acids are NOT DETECTED. The SARS-CoV-2 RNA is generally detectable in upper and lower  respiratory specimens during the acute phase of infection. The lowest  concentration of SARS-CoV-2 viral copies this assay can  detect is 250  copies / mL. A negative result does not preclude SARS-CoV-2 infection  and should not be used as the sole basis for treatment or other  patient management decisions.  A negative result may occur with  improper specimen collection / handling, submission of specimen other  than nasopharyngeal swab, presence of viral mutation(s) within the  areas targeted by this assay, and inadequate number of viral copies  (<250 copies / mL). A negative result must be combined with clinical  observations, patient history, and epidemiological information. If result is POSITIVE SARS-CoV-2 target nucleic acids are DETECTED. T he SARS-CoV-2 RNA is generally detectable in upper and lower  respiratory specimens during the acute phase of infection.  Positive  results are indicative of active infection with SARS-CoV-2.  Clinical  correlation with patient history and other diagnostic information is  necessary to determine patient infection status.  Positive results do  not rule out bacterial infection or co-infection with other viruses. If result is PRESUMPTIVE POSTIVE SARS-CoV-2 nucleic acids MAY BE PRESENT.   A  presumptive positive result was obtained on the submitted specimen  and confirmed on repeat testing.  While 2019 novel coronavirus  (SARS-CoV-2) nucleic acids may be present in the submitted sample  additional confirmatory testing may be necessary for epidemiological  and / or clinical management purposes  to differentiate between  SARS-CoV-2 and other Sarbecovirus currently known to infect humans.  If clinically indicated additional testing with an alternate test  methodology [phone]([id number]) is  advised. The SARS-CoV-2 RNA is generally  detectable in upper and lower respiratory specimens during the acute  phase of infection. The expected result is Negative. Fact Sheet for Patients:  BoilerBrush.com.cyhttps://www.fda.gov/media/136312/download Fact Sheet for Healthcare Providers: https://pope.com/https://www.fda.gov/media/136313/download This test is not yet approved or cleared by the [place] FDA and has been authorized for detection and/or diagnosis of SARS-CoV-2 by FDA under an Emergency Use Authorization (EUA).  This EUA will remain in effect (meaning this test can be used) for the duration of the COVID-19 declaration under Section 564(b)(1) of the Act, 21 U.S.C. section 360bbb-3(b)(1), unless the authorization is terminated or revoked sooner. Performed at [facility], [address] [place], [place] [address]   Blood Culture (routine x 2)     Status: None (Preliminary result)   Collection Time: 11/12/18  6:13 AM   Specimen: BLOOD  Result Value Ref Range Status   Specimen Description BLOOD RIGHT [facility]  Final   Special Requests   Final    BOTTLES DRAWN AEROBIC AND ANAEROBIC Blood Culture adequate volume   Culture   Final    NO GROWTH 1 DAY Performed at [facility], [address] [place], [place] [address]    Report Status PENDING  Incomplete  Blood Culture (routine x 2)     Status: None (Preliminary result)   Collection Time: 11/12/18  6:13 AM   Specimen: BLOOD  Result Value Ref Range  Status   Specimen Description BLOOD RIGHT WRIST  Final   Special Requests   Final    BOTTLES DRAWN AEROBIC AND ANAEROBIC Blood Culture adequate volume   Culture   Final    NO GROWTH 1 DAY Performed at [facility], [address] [place], [place] [address]    Report Status PENDING  Incomplete         Radiology Studies: Ct Angio Chest Pe W Or Wo Contrast  Result Date: 11/13/2018 CLINICAL DATA:  PE suspected, intermediate prob, positive D-dimer. Progressive shortness of breath  and chest pain. COVID-19 positive. EXAM: CT ANGIOGRAPHY CHEST WITH CONTRAST TECHNIQUE: Multidetector CT imaging of the chest was performed using the standard protocol during bolus administration of intravenous contrast. Multiplanar CT image reconstructions and MIPs were obtained to evaluate the vascular anatomy. CONTRAST:  152mL OMNIPAQUE IOHEXOL 350 MG/ML SOLN COMPARISON:  Chest radiograph earlier this day. FINDINGS: Cardiovascular: Examination is technically limited due to soft tissue attenuation from habitus, breathing motion artifact, contrast bolus timing. Allowing for this, no filling defects in the pulmonary arteries to suggest pulmonary embolus. Examination is diagnostic to the level of the proximal segmental level. Thoracic aorta is normal in caliber without dissection. Heart is normal in size. No pericardial effusion. Mediastinum/Nodes: Small mediastinal nodes not enlarged by size criteria. The esophagus is decompressed. Thyroid gland is unremarkable. Lungs/Pleura: Multifocal pulmonary opacities with ground-glass and confluent opacities in both lungs. Confluent dependent opacity in the right lower lobe is most prominent. There is central air bronchograms. No pleural fluid. Upper Abdomen: Suspected hepatic steatosis. No acute findings. Musculoskeletal: There are no acute or suspicious osseous abnormalities. Review of the MIP images confirms the above findings. IMPRESSION: 1. No central pulmonary embolus,  branches distal to the segmental level are not well assessed due to breathing motion, soft tissue attenuation from habitus and contrast bolus timing. 2. Multifocal pneumonia in a pattern consistent with COVID-19 infection. Confluent ground-glass opacities throughout both lungs, most prominent in the right lower lobe. Electronically Signed   By: Keith Rake M.D.   On: 11/12/2018 22:24   Ct Angio Chest Pe W Or Wo Contrast  Result Date: 11/12/2018 CLINICAL DATA:  PE suspected, intermediate prob, positive D-dimer. Progressive shortness of breath and chest pain. COVID-19 positive. EXAM: CT ANGIOGRAPHY CHEST WITH CONTRAST TECHNIQUE: Multidetector CT imaging of the chest was performed using the standard protocol during bolus administration of intravenous contrast. Multiplanar CT image reconstructions and MIPs were obtained to evaluate the vascular anatomy. CONTRAST:  139mL OMNIPAQUE IOHEXOL 350 MG/ML SOLN COMPARISON:  Chest radiograph earlier this day. FINDINGS: Cardiovascular: Examination is technically limited due to soft tissue attenuation from habitus, breathing motion artifact, contrast bolus timing. Allowing for this, no filling defects in the pulmonary arteries to suggest pulmonary embolus. Examination is diagnostic to the level of the proximal segmental level. Thoracic aorta is normal in caliber without dissection. Heart is normal in size. No pericardial effusion. Mediastinum/Nodes: Small mediastinal nodes not enlarged by size criteria. The esophagus is decompressed. Thyroid gland is unremarkable. Lungs/Pleura: Multifocal pulmonary opacities with ground-glass and confluent opacities in both lungs. Confluent dependent opacity in the right lower lobe is most prominent. There is central air bronchograms. No pleural fluid. Upper Abdomen: Suspected hepatic steatosis. No acute findings. Musculoskeletal: There are no acute or suspicious osseous abnormalities. Review of the MIP images confirms the above findings.  IMPRESSION: 1. No central pulmonary embolus, branches distal to the segmental level are not well assessed due to breathing motion, soft tissue attenuation from habitus and contrast bolus timing. 2. Multifocal pneumonia in a pattern consistent with COVID-19 infection. Confluent ground-glass opacities throughout both lungs, most prominent in the right lower lobe. Electronically Signed   By: Keith Rake M.D.   On: 11/12/2018 22:24   Dg Chest Port 1 View  Result Date: 11/12/2018 CLINICAL DATA:  Shortness of breath, COVID positive EXAM: PORTABLE CHEST 1 VIEW COMPARISON:  None. FINDINGS: Multifocal patchy opacities in the bilateral upper lobes and left lower lobe, compatible with multifocal pneumonia. No pleural effusion or pneumothorax. Mild eventration of the right hemidiaphragm.  The heart is normal in size. IMPRESSION: Multifocal pneumonia, as above. Electronically Signed   By: Charline BillsSriyesh  Krishnan M.D.   On: 11/12/2018 07:02        Scheduled Meds:  dexamethasone  6 mg Oral Daily   enoxaparin (LOVENOX) injection  70 mg Subcutaneous Q24H   vitamin C  500 mg Oral Daily   zinc sulfate  220 mg Oral Daily   Continuous Infusions:  [START ON 11/14/2018] remdesivir 100 mg in NS 250 mL       LOS: 1 day    Time spent: 35 minutes      Alberteen Samhristopher P Gary Bultman, MD [facility] 11/13/2018, 6:16 PM     Please page through AMION:  [email or web address] If 7PM-7AM, please contact night-coverage

## 2018-11-14 LAB — COMPREHENSIVE METABOLIC PANEL
ALT: 59 U/L — ABNORMAL HIGH (ref 0–44)
AST: 44 U/L — ABNORMAL HIGH (ref 15–41)
Albumin: 3.3 g/dL — ABNORMAL LOW (ref 3.5–5.0)
Alkaline Phosphatase: 49 U/L (ref 38–126)
Anion gap: 12 (ref 5–15)
BUN: 15 mg/dL (ref 6–20)
CO2: 26 mmol/L (ref 22–32)
Calcium: 8.6 mg/dL — ABNORMAL LOW (ref 8.9–10.3)
Chloride: 103 mmol/L (ref 98–111)
Creatinine, Ser: 0.73 mg/dL (ref 0.61–1.24)
GFR calc Af Amer: >60 mL/min (ref >60)
GFR calc non Af Amer: >60 mL/min (ref >60)
Glucose, Bld: 126 mg/dL — ABNORMAL HIGH (ref 70–99)
Potassium: 3.4 mmol/L — ABNORMAL LOW (ref 3.5–5.1)
Sodium: 141 mmol/L (ref 135–145)
Total Bilirubin: 0.6 mg/dL (ref 0.3–1.2)
Total Protein: 7.7 g/dL (ref 6.5–8.1)

## 2018-11-14 LAB — CBC WITH DIFFERENTIAL/PLATELET
Abs Immature Granulocytes: 0.05 10*3/uL (ref 0.00–0.07)
Basophils Absolute: 0 10*3/uL (ref 0.0–0.1)
Basophils Relative: 0 %
Eosinophils Absolute: 0 10*3/uL (ref 0.0–0.5)
Eosinophils Relative: 0 %
HCT: 41.7 % (ref 39.0–52.0)
Hemoglobin: 13.6 g/dL (ref 13.0–17.0)
Immature Granulocytes: 1 %
Lymphocytes Relative: 8 %
Lymphs Abs: 0.7 10*3/uL (ref 0.7–4.0)
MCH: 28.2 pg (ref 26.0–34.0)
MCHC: 32.6 g/dL (ref 30.0–36.0)
MCV: 86.5 fL (ref 80.0–100.0)
Monocytes Absolute: 0.6 10*3/uL (ref 0.1–1.0)
Monocytes Relative: 7 %
Neutro Abs: 7.8 10*3/uL — ABNORMAL HIGH (ref 1.7–7.7)
Neutrophils Relative %: 84 %
Platelets: 218 10*3/uL (ref 150–400)
RBC: 4.82 MIL/uL (ref 4.22–5.81)
RDW: 12 % (ref 11.5–15.5)
WBC: 9.2 10*3/uL (ref 4.0–10.5)
nRBC: 0 % (ref 0.0–0.2)

## 2018-11-14 LAB — C-REACTIVE PROTEIN: CRP: 6.1 mg/dL — ABNORMAL HIGH (ref <1.0)

## 2018-11-14 LAB — HIV ANTIBODY (ROUTINE TESTING W REFLEX): HIV Screen 4th Generation wRfx: NONREACTIVE

## 2018-11-14 LAB — D-DIMER, QUANTITATIVE: D-Dimer, Quant: 0.29 ug/mL-FEU (ref 0.00–0.50)

## 2018-11-14 LAB — FERRITIN: Ferritin: 623 ng/mL — ABNORMAL HIGH (ref 24–336)

## 2018-11-14 MED ORDER — SALINE SPRAY 0.65 % NA SOLN
1.0000 | NASAL | Status: DC | PRN
  Filled 2018-11-14: qty 44

## 2018-11-14 MED ORDER — POTASSIUM CHLORIDE CRYS ER 20 MEQ PO TBCR
40.0000 meq | EXTENDED_RELEASE_TABLET | Freq: Two times a day (BID) | ORAL | Status: DC
  Administered 2018-11-14 – 2018-11-17 (×7): 40 meq via ORAL
  Filled 2018-11-14 (×7): qty 2

## 2018-11-14 NOTE — Progress Notes (Signed)
Pt in touch with his family

## 2018-11-14 NOTE — Progress Notes (Signed)
Patient is alert and oriented x  4. Ambulates to bathroom independently. On room air with O2 SAT 92-94% at rest. During ambulation O2 SAT 87-90%. Call bell within reach. Encouraged to report signs and symptoms to staff.

## 2018-11-14 NOTE — Progress Notes (Signed)
PROGRESS NOTE    Dustin Mccann  ZHY:[id number] DOB: 30-Jun-1990 DOA: 11/12/2018 PCP: [facility]      Brief Narrative:  Dustin Mccann is a 28 y.o. M with hx obesity who presents with 1 week cough, fever, now progressive dyspnea on exertion, dizziness, hemoptysis.  The patient was at usual state of health until 6/9 when he developed cough, fever, body aches.  His mother had developed symptoms around the same time.  On Mon 3 days PTA, he went to [facility] ER where he tested positive for SARS-CoV-2, had developing pneumonia on chest x-ray, but was stable on room air and was discharged the next day.    Over the next 3 days, he is started to feel more dyspneic with exertion, chest discomfort, and dizziness with ambulation.  He has had continued fevers and cough productive of once bloody sputum.   At [facility], he had bilateral pneumonia on chest x-ray, was hypoxic to 87% on room air.  Had lymphopenia and transaminitis.  Had newly elevated d-dimer.  Lactate, renal function, procalcitonin all normal.  He was given Decadron and transferred to [facility].        Assessment & Plan:  Coronavirus pneumonitis with acute hypoxic respiratory failure In setting of ongoing 2020 COVID-19 pandemic.  Intermittently tachycardic and tachypneic overnight.  At rest, HR and RR normalize.  SpO2 drops to 70s with exertion.    Remains on 2L, with SpO2 91% at rest.    Inflammatory markers trending down. -Continue remdesivir, day 2 of 5 -Continue steroids  -Continue VTE PPx with Lovenox -Continue Zinc and Vitamin C -Trend d-dimer, ferritin and CRP  COVID-19 Labs Recent Labs    11/12/18 0601 11/13/18 0300 11/14/18 0226  DDIMER  --  0.41 0.29  FERRITIN 738* 764* 623*  LDH 260*  --   --   CRP 9.7* 8.7* 6.1*      Hypokalemia Mag normal -Repeat K  Transaminitis Stable -Trend while on remdesivir -Follow viral serologies, HIV      MDM and  disposition: The below labs and imaging reports were reviewed and summarized above.  Medication management as above.  The patient was admitted with acute hypoxic respiratory failure from COVID-19.  This is an acute illness that poses a severe threat to life.              DVT prophylaxis: Lovenox Code Status: FULL Family Communication: None    Consultants:   None  Procedures:   CTA chest  Antimicrobials:   Ceftriaxone x1 6/18  Azithromycin x1 6/18   Culture data:   6/18 blood culture x2 -- NGTD       Subjective: Dyspnea is somewhat improved, although he still requires oxygen and is extremely dizzy and out of breath with any exertion at all.  No new fever no chest pain, no sputum, no vomiting.  Still with diarrhea.  Objective: Vitals:   11/14/18 0533 11/14/18 0800 11/14/18 1000 11/14/18 1200  BP:  115/69 112/75 103/66  Pulse:  91 79 85  Resp:      Temp: 98.9 F (37.2 C) 98.2 F (36.8 C)    TempSrc: Oral Oral    SpO2:  93% 90% 91%    Intake/Output Summary (Last 24 hours) at 11/14/2018 1733 Last data filed at 11/14/2018 1418 Gross per 24 hour  Intake 300 ml  Output --  Net 300 ml   There were no vitals filed for this visit.  Examination: General appearance:  Obese adult male, lying prone, no acute distress, appears tired HEENT: Anicteric, conjunctiva pink, lids and lashes normal. No nasal deformity, discharge, epistaxis.  Lips moist, dentition normal, OP moist, no oral lesions, hearing normal.   Skin: Warm and dry.  no jaundice.  No suspicious rashes or lesions. Cardiac: Tachycardic, regular, no murmurs appreciated, JVP not visible due to body habitus, no lower extremity edema Respiratory: Respiratory effort shallow but normal rate at rest, lung sounds diminished throughout, no wheezing. Abdomen: Prone MSK: No deformities or effusions. Neuro: Awake and alert, extraocular movements intact, moves all extremities, speech fluent    Psych: Sensorium  intact responding to questions, attention normal, affect normal, judgment insight appear normal.      Data Reviewed: I have personally reviewed following labs and imaging studies:  CBC: Recent Labs  Lab 11/12/18 0601 11/13/18 0300 11/14/18 0226  WBC 4.4 8.7 9.2  NEUTROABS 3.5 7.3 7.8*  HGB 15.3 15.1 13.6  HCT 44.4 44.4 41.7  MCV 82.7 85.2 86.5  PLT 145* 185 218   Basic Metabolic Panel: Recent Labs  Lab 11/12/18 0601 11/13/18 0300 11/14/18 0226  NA 135 138 141  K 3.2* 3.4* 3.4*  CL 100 103 103  CO2 21* 25 26  GLUCOSE 146* 165* 126*  BUN CREATININE 0.81 0.72 0.73  CALCIUM 8.6* 8.8* 8.6*  MG  --  2.1  --    GFR: Estimated Creatinine Clearance: 194.9 mL/min (by C-G formula based on SCr of 0.73 mg/dL). Liver Function Tests: Recent Labs  Lab 11/12/18 0601 11/13/18 0300 11/14/18 0226  AST 52* 42* 44*  ALT 54* 54* 59*  ALKPHOS 55 55 49  BILITOT 1.0 0.3 0.6  PROT 8.3* 7.9 7.7  ALBUMIN 3.7 3.5 3.3*   No results for input(s): LIPASE, AMYLASE in the last 168 hours. No results for input(s): AMMONIA in the last 168 hours. Coagulation Profile: No results for input(s): INR, PROTIME in the last 168 hours. Cardiac Enzymes: Recent Labs  Lab 11/13/18 0300  TROPONINI <0.03   BNP (last 3 results) No results for input(s): PROBNP in the last 8760 hours. HbA1C: No results for input(s): HGBA1C in the last 72 hours. CBG: No results for input(s): GLUCAP in the last 168 hours. Lipid Profile: Recent Labs    11/12/18 0601  TRIG 114   Thyroid Function Tests: No results for input(s): TSH, T4TOTAL, FREET4, T3FREE, THYROIDAB in the last 72 hours. Anemia Panel: Recent Labs    11/13/18 0300 11/14/18 0226  FERRITIN 764* 623*   Urine analysis:    Component Value Date/Time   COLORURINE YELLOW (A) 07/15/2015 1115   APPEARANCEUR CLEAR (A) 07/15/2015 1115   LABSPEC 1.035 (H) 07/15/2015 1115   PHURINE 5.0 07/15/2015 1115   GLUCOSEU NEGATIVE 07/15/2015 1115    HGBUR NEGATIVE 07/15/2015 1115   BILIRUBINUR NEGATIVE 07/15/2015 1115   KETONESUR NEGATIVE 07/15/2015 1115   PROTEINUR 30 (A) 07/15/2015 1115   NITRITE NEGATIVE 07/15/2015 1115   LEUKOCYTESUR NEGATIVE 07/15/2015 1115   Sepsis Labs: (procalcitonin:4,lacticacidven:4)  ) Recent Results (from the past 240 hour(s))  SARS Coronavirus 2 [facility] order, Performed in [facility] hospital lab)     Status: Abnormal   Collection Time: 11/12/18  6:01 AM   Specimen: Nasopharyngeal Swab  Result Value Ref Range Status   SARS Coronavirus 2 POSITIVE (A) NEGATIVE Final    Comment: RESULT CALLED TO, READ BACK BY AND VERIFIED WITH:  TERESA CLAPP AT 0732 11/12/2018 SDR (NOTE) If result is NEGATIVE SARS-CoV-2 target  nucleic acids are NOT DETECTED. The SARS-CoV-2 RNA is generally detectable in upper and lower  respiratory specimens during the acute phase of infection. The lowest  concentration of SARS-CoV-2 viral copies this assay can detect is 250  copies / mL. A negative result does not preclude SARS-CoV-2 infection  and should not be used as the sole basis for treatment or other  patient management decisions.  A negative result may occur with  improper specimen collection / handling, submission of specimen other  than nasopharyngeal swab, presence of viral mutation(s) within the  areas targeted by this assay, and inadequate number of viral copies  (<250 copies / mL). A negative result must be combined with clinical  observations, patient history, and epidemiological information. If result is POSITIVE SARS-CoV-2 target nucleic acids are DETECTED. T he SARS-CoV-2 RNA is generally detectable in upper and lower  respiratory specimens during the acute phase of infection.  Positive  results are indicative of active infection with SARS-CoV-2.  Clinical  correlation with patient history and other diagnostic information is  necessary to determine patient infection status.  Positive results do   not rule out bacterial infection or co-infection with other viruses. If result is PRESUMPTIVE POSTIVE SARS-CoV-2 nucleic acids MAY BE PRESENT.   A presumptive positive result was obtained on the submitted specimen  and confirmed on repeat testing.  While 2019 novel coronavirus  (SARS-CoV-2) nucleic acids may be present in the submitted sample  additional confirmatory testing may be necessary for epidemiological  and / or clinical management purposes  to differentiate between  SARS-CoV-2 and other Sarbecovirus currently known to infect humans.  If clinically indicated additional testing with an alternate test  methodology [phone]) is  advised. The SARS-CoV-2 RNA is generally  detectable in upper and lower respiratory specimens during the acute  phase of infection. The expected result is Negative. Fact Sheet for Patients:  BoilerBrush.com.cyhttps://www.fda.gov/media/136312/download Fact Sheet for Healthcare Providers: https://pope.com/https://www.fda.gov/media/136313/download This test is not yet approved or cleared by the [place] FDA and has been authorized for detection and/or diagnosis of SARS-CoV-2 by FDA under an Emergency Use Authorization (EUA).  This EUA will remain in effect (meaning this test can be used) for the duration of the COVID-19 declaration under Section 564(b)(1) of the Act, 21 U.S.C. section 360bbb-3(b)(1), unless the authorization is terminated or revoked sooner. Performed at [facility], [address] [place], [place] [address]   Blood Culture (routine x 2)     Status: None (Preliminary result)   Collection Time: 11/12/18  6:13 AM   Specimen: BLOOD  Result Value Ref Range Status   Specimen Description [facility]LOOD [facility]  Final   Special Requests   Final    BOTTLES DRAWN AEROBIC AND ANAEROBIC Blood Culture adequate volume   Culture   Final    NO GROWTH 2 DAYS Performed at [facility], [address] [place], [place] [address]    Report Status PENDING  Incomplete   Blood Culture (routine x 2)     Status: None (Preliminary result)   Collection Time: 11/12/18  6:13 AM   Specimen: BLOOD  Result Value Ref Range Status   Specimen Description BLOOD RIGHT WRIST  Final   Special Requests   Final    BOTTLES DRAWN AEROBIC AND ANAEROBIC Blood Culture adequate volume   Culture   Final    NO GROWTH 2 DAYS Performed at [facility], [address] [place], [place] [address]    Report Status PENDING  Incomplete  Radiology Studies: Ct Angio Chest Pe W Or Wo Contrast  Addendum Date: 11/13/2018   ADDENDUM REPORT: 11/13/2018 18:58 ADDENDUM: This addendum is created to disregard this report. This is a duplicate accession, the report has been removed to accession [id number]. Electronically Signed   By: Narda RutherfordMelanie  Sanford M.D.   On: 11/13/2018 18:58   Result Date: 11/13/2018 CLINICAL DATA:  PE suspected, intermediate prob, positive D-dimer. Progressive shortness of breath and chest pain. COVID-19 positive. EXAM: CT ANGIOGRAPHY CHEST WITH CONTRAST TECHNIQUE: Multidetector CT imaging of the chest was performed using the standard protocol during bolus administration of intravenous contrast. Multiplanar CT image reconstructions and MIPs were obtained to evaluate the vascular anatomy. CONTRAST:  100mL OMNIPAQUE IOHEXOL 350 MG/ML SOLN COMPARISON:  Chest radiograph earlier this day. FINDINGS: Cardiovascular: Examination is technically limited due to soft tissue attenuation from habitus, breathing motion artifact, contrast bolus timing. Allowing for this, no filling defects in the pulmonary arteries to suggest pulmonary embolus. Examination is diagnostic to the level of the proximal segmental level. Thoracic aorta is normal in caliber without dissection. Heart is normal in size. No pericardial effusion. Mediastinum/Nodes: Small mediastinal nodes not enlarged by size criteria. The esophagus is decompressed. Thyroid gland is unremarkable. Lungs/Pleura: Multifocal  pulmonary opacities with ground-glass and confluent opacities in both lungs. Confluent dependent opacity in the right lower lobe is most prominent. There is central air bronchograms. No pleural fluid. Upper Abdomen: Suspected hepatic steatosis. No acute findings. Musculoskeletal: There are no acute or suspicious osseous abnormalities. Review of the MIP images confirms the above findings. IMPRESSION: 1. No central pulmonary embolus, branches distal to the segmental level are not well assessed due to breathing motion, soft tissue attenuation from habitus and contrast bolus timing. 2. Multifocal pneumonia in a pattern consistent with COVID-19 infection. Confluent ground-glass opacities throughout both lungs, most prominent in the right lower lobe. Electronically Signed: By: Narda RutherfordMelanie  Sanford M.D. On: 11/12/2018 22:24   Ct Angio Chest Pe W Or Wo Contrast  Result Date: 11/13/2018 CLINICAL DATA:  PE suspected, intermediate prob, positive D-dimer. Progressive shortness of breath and chest pain. COVID-19 positive. EXAM: CT ANGIOGRAPHY CHEST WITH CONTRAST TECHNIQUE: Multidetector CT imaging of the chest was performed using the standard protocol during bolus administration of intravenous contrast. Multiplanar CT image reconstructions and MIPs were obtained to evaluate the vascular anatomy. CONTRAST:  100mL OMNIPAQUE IOHEXOL 350 MG/ML SOLN COMPARISON:  Chest radiograph earlier this day. FINDINGS: Cardiovascular: Examination is technically limited due to soft tissue attenuation from habitus, breathing motion artifact, contrast bolus timing. Allowing for this, no filling defects in the pulmonary arteries to suggest pulmonary embolus. Examination is diagnostic to the level of the proximal segmental level. Thoracic aorta is normal in caliber without dissection. Heart is normal in size. No pericardial effusion. Mediastinum/Nodes: Small mediastinal nodes not enlarged by size criteria. The esophagus is decompressed. Thyroid gland  is unremarkable. Lungs/Pleura: Multifocal pulmonary opacities with ground-glass and confluent opacities in both lungs. Confluent dependent opacity in the right lower lobe is most prominent. There is central air bronchograms. No pleural fluid. Upper Abdomen: Suspected hepatic steatosis. No acute findings. Musculoskeletal: There are no acute or suspicious osseous abnormalities. Review of the MIP images confirms the above findings. IMPRESSION: 1. No central pulmonary embolus, branches distal to the segmental level are not well assessed due to breathing motion, soft tissue attenuation from habitus and contrast bolus timing. 2. Multifocal pneumonia in a pattern consistent with COVID-19 infection. Confluent ground-glass opacities throughout both lungs, most prominent in the right lower  lobe. Electronically Signed   By: Narda RutherfordMelanie  Sanford M.D.   On: 11/12/2018 22:24        Scheduled Meds:  dexamethasone  6 mg Oral Daily   enoxaparin (LOVENOX) injection  70 mg Subcutaneous Q24H   potassium chloride  40 mEq Oral BID   vitamin C  500 mg Oral Daily   zinc sulfate  220 mg Oral Daily   Continuous Infusions:  remdesivir 100 mg in NS 250 mL 100 mg (11/14/18 0947)     LOS: 2 days    Time spent: 25 minutes     Alberteen Samhristopher P Jayshaun Phillips, MD [facility] 11/14/2018, 5:33 PM     Please page through AMION:  [email or web address] If 7PM-7AM, please contact night-coverage

## 2018-11-14 NOTE — Progress Notes (Signed)
Pt on phone to family 

## 2018-11-15 LAB — CBC WITH DIFFERENTIAL/PLATELET
Abs Immature Granulocytes: 0.07 10*3/uL (ref 0.00–0.07)
Basophils Absolute: 0 10*3/uL (ref 0.0–0.1)
Basophils Relative: 0 %
Eosinophils Absolute: 0 10*3/uL (ref 0.0–0.5)
Eosinophils Relative: 0 %
HCT: 41.2 % (ref 39.0–52.0)
Hemoglobin: 13.5 g/dL (ref 13.0–17.0)
Immature Granulocytes: 1 %
Lymphocytes Relative: 13 %
Lymphs Abs: 0.8 10*3/uL (ref 0.7–4.0)
MCH: 28.4 pg (ref 26.0–34.0)
MCHC: 32.8 g/dL (ref 30.0–36.0)
MCV: 86.6 fL (ref 80.0–100.0)
Monocytes Absolute: 0.6 10*3/uL (ref 0.1–1.0)
Monocytes Relative: 10 %
Neutro Abs: 4.5 10*3/uL (ref 1.7–7.7)
Neutrophils Relative %: 76 %
Platelets: 256 10*3/uL (ref 150–400)
RBC: 4.76 MIL/uL (ref 4.22–5.81)
RDW: 11.8 % (ref 11.5–15.5)
WBC: 5.9 10*3/uL (ref 4.0–10.5)
nRBC: 0 % (ref 0.0–0.2)

## 2018-11-15 LAB — COMPREHENSIVE METABOLIC PANEL
ALT: 63 U/L — ABNORMAL HIGH (ref 0–44)
AST: 30 U/L (ref 15–41)
Albumin: 3.2 g/dL — ABNORMAL LOW (ref 3.5–5.0)
Alkaline Phosphatase: 43 U/L (ref 38–126)
Anion gap: 8 (ref 5–15)
BUN: 17 mg/dL (ref 6–20)
CO2: 27 mmol/L (ref 22–32)
Calcium: 8.7 mg/dL — ABNORMAL LOW (ref 8.9–10.3)
Chloride: 105 mmol/L (ref 98–111)
Creatinine, Ser: 0.67 mg/dL (ref 0.61–1.24)
GFR calc Af Amer: >60 mL/min (ref >60)
GFR calc non Af Amer: >60 mL/min (ref >60)
Glucose, Bld: 150 mg/dL — ABNORMAL HIGH (ref 70–99)
Potassium: 3.9 mmol/L (ref 3.5–5.1)
Sodium: 140 mmol/L (ref 135–145)
Total Bilirubin: 0.2 mg/dL — ABNORMAL LOW (ref 0.3–1.2)
Total Protein: 7.2 g/dL (ref 6.5–8.1)

## 2018-11-15 LAB — FERRITIN: Ferritin: 573 ng/mL — ABNORMAL HIGH (ref 24–336)

## 2018-11-15 LAB — C-REACTIVE PROTEIN: CRP: 3.1 mg/dL — ABNORMAL HIGH (ref <1.0)

## 2018-11-15 LAB — D-DIMER, QUANTITATIVE: D-Dimer, Quant: 0.27 ug/mL-FEU (ref 0.00–0.50)

## 2018-11-15 NOTE — Progress Notes (Addendum)
O2 SAT at rest on 2 liters nasal canula lying down: 92-94% but on room air O2 SAT 88%.  O2 SAT at rest on 2 liters nasal canula sitting is 88-90% but on room air 87-88%.  O2 SAT standing on 2 liters nasal canula 88-89% but on room air 86-87%.  O2 SAT during ambulation on 2 liters nasal canula 85-87% but on room air 85%.  Patient encouraged to perform incentive spirometer. Able to achieve a score of 1,000 on 2 out of 5 attempts with non-productive cough noted. Currently sitting on recliner with 2 liters nasal canula; O2 SAT 90-92%.

## 2018-11-15 NOTE — Progress Notes (Signed)
Encouraged to increase activity and increase use of incentive spirometer- so he has been sitting in recliner 1st half of this shift. He is keeping his mother updated himself

## 2018-11-15 NOTE — Progress Notes (Signed)
PROGRESS NOTE    Dustin Mccann  ZOX:[id number] DOB: May 28, 1990 DOA: 11/12/2018 [place] [place]      Brief Narrative:  Dustin Mccann is a 28 y.o. M with hx obesity who presents with 1 week cough, fever, now progressive dyspnea on exertion, dizziness, hemoptysis.  The patient was at usual state of health until 6/9 when he developed cough, fever, body aches.  His mother had developed symptoms around the same time.  On Mon 3 days PTA, [facility] where he tested positive for SARS-CoV-2, had developing pneumonia on chest x-ray, but was stable on room air and was discharged the next day.    Over the next 3 days, he is started to feel more dyspneic with exertion, chest discomfort, and dizziness with ambulation.  He has had continued fevers and cough productive of once bloody sputum.   At [facility], he had bilateral pneumonia on chest x-ray, was hypoxic to 87% on room air.  Had lymphopenia and transaminitis.  Had newly elevated d-dimer.  Lactate, renal function, procalcitonin all normal.  He was given Decadron and transferred to [facility].        Assessment & Plan:  Coronavirus pneumonitis with acute hypoxic respiratory failure In setting of ongoing 2020 COVID-19 pandemic.  Intermittently tachycardic and tachypneic overnight.  At rest, HR and RR normalize.  SpO2 drops to 70s with exertion.    Inflammatory markers continuing to trend down, O2 sats stable.   Less dysnpeic with exerdtion today.  -Continue remdesivir, day 3 of 5 -Continue steroids -Wean O2 as able -Continue VTE PPx with Lovenox -Continue Zinc and Vitamin C -Daily d-dimer, ferritin and CRP  COVID-19 Labs Recent Labs    11/13/18 0300 11/14/18 0226 11/15/18 0237  DDIMER 0.41 0.29 0.27  FERRITIN 764* 623* 573*  CRP 8.7* 6.1* 3.1*      Hypokalemia Mag normal, K normal  Transaminitis Improved -Daily LFTs while on remdesivir -Follow viral serologies, HIV      MDM and disposition: The below labs and imaging reports reviewed and summarized above.  Medication management as above.  The patient was admitted with acute hypoxic respiratory failure from COVID-19.  This is an acute illness that poses a severe threat to life.    He is slowly improving, but still requires 2L O2 at all times, and is severely dyspneic with exertion, possibly home in the next 1 to 2 days.          DVT prophylaxis: Lovenox Code Status: FULL Family Communication:      Consultants:   None  Procedures:   CTA chest  Antimicrobials:   Ceftriaxone x1 6/18  Azithromycin x1 6/18   Culture data:   6/18 blood culture x2 -- NGTD       Subjective: Dyspnea with exertion is somewhat improved, though still notable.  He has not is dizzy with exertion.  No new fever, chest pain, sputum, vomiting.  Diarrhea is improved.  Objective: Vitals:   11/14/18 2000 11/15/18 0455 11/15/18 0828 11/15/18 1020  BP:  119/73 118/90   Pulse:  69 70   Resp:  20    Temp: 98.5 F (36.9 C) 97.7 F (36.5 C) 98.3 F (36.8 C)   TempSrc:  Oral Oral   SpO2:  93% 92%   Weight:    (!) 141.4 kg  Height:    5\' 8"  (1.727 m)    Intake/Output Summary (Last 24 hours) at 11/15/2018 1144 Last data filed  at 11/15/2018 0900 Gross per 24 hour  Intake 420 ml  Output -  Net 420 ml   Filed Weights   11/15/18 1020  Weight: (!) 141.4 kg    Examination: General appearance: Obese adult male, lying in bed, no acute distress, fatigue. HEENT: Anicteric, conjunctiva pink, lids and lashes normal. No nasal deformity, discharge, epistaxis.  Lips moist, dentition normal, OP moist, no oral lesions, hearing normal.   Skin: Warm and dry.  no jaundice.  No suspicious rashes or lesions. Cardiac: Heart rate regular, no murmurs, JVP not visible, no lower extremity edema.   Respiratory: Respiratory effort normal at rest, lung sounds diminished, no rales. Abdomen: Abdomen soft without tenderness to  palpation. MSK: No deformities or effusions. Neuro: Wake and alert, extraocular movements intact, no moves all extremities with normal strength and coordination speech fluent Psych: Sensorium intact responding to questions, attention normal, judgment normal, affect normal      Data Reviewed: I have personally reviewed following labs and imaging studies:  CBC: Recent Labs  Lab 11/12/18 0601 11/13/18 0300 11/14/18 0226 11/15/18 0237  WBC 4.4 8.7 9.2 5.9  NEUTROABS 3.5 7.3 7.8* 4.5  HGB 15.3 15.1 13.6 13.5  HCT 44.4 44.4 41.7 41.2  MCV 82.7 85.2 86.5 86.6  PLT 145* 185 218 256   Basic Metabolic Panel: Recent Labs  Lab 11/12/18 0601 11/13/18 0300 11/14/18 0226 11/15/18 0237  NA 135 138 141 140  K 3.2* 3.4* 3.4* 3.9  CL 100 103 103 105  CO2 21* 25 26 27   GLUCOSE 146* 165* 126* 150*  BUN 13 16 15 17   CREATININE 0.81 0.72 0.73 0.67  CALCIUM 8.6* 8.8* 8.6* 8.7*  MG  --  2.1  --   --    GFR: Estimated Creatinine Clearance: 194.9 mL/min (by C-G formula based on SCr of 0.67 mg/dL). Liver Function Tests: Recent Labs  Lab 11/12/18 0601 11/13/18 0300 11/14/18 0226 11/15/18 0237  AST 52* 42* 44* 30  ALT 54* 54* 59* 63*  ALKPHOS 55 55 49 43  BILITOT 1.0 0.3 0.6 0.2*  PROT 8.3* 7.9 7.7 7.2  ALBUMIN 3.7 3.5 3.3* 3.2*   No results for input(s): LIPASE, AMYLASE in the last 168 hours. No results for input(s): AMMONIA in the last 168 hours. Coagulation Profile: No results for input(s): INR, PROTIME in the last 168 hours. Cardiac Enzymes: Recent Labs  Lab 11/13/18 0300  TROPONINI <0.03   BNP (last 3 results) No results for input(s): PROBNP in the last 8760 hours. HbA1C: No results for input(s): HGBA1C in the last 72 hours. CBG: No results for input(s): GLUCAP in the last 168 hours. Lipid Profile: No results for input(s): CHOL, HDL, LDLCALC, TRIG, CHOLHDL, LDLDIRECT in the last 72 hours. Thyroid Function Tests: No results for input(s): TSH, T4TOTAL, FREET4,  T3FREE, THYROIDAB in the last 72 hours. Anemia Panel: Recent Labs    11/14/18 0226 11/15/18 0237  FERRITIN 623* 573*   Urine analysis:    Component Value Date/Time   COLORURINE YELLOW (A) 07/15/2015 1115   APPEARANCEUR CLEAR (A) 07/15/2015 1115   LABSPEC 1.035 (H) 07/15/2015 1115   PHURINE 5.0 07/15/2015 1115   GLUCOSEU NEGATIVE 07/15/2015 1115   HGBUR NEGATIVE 07/15/2015 1115   BILIRUBINUR NEGATIVE 07/15/2015 1115   KETONESUR NEGATIVE 07/15/2015 1115   PROTEINUR 30 (A) 07/15/2015 1115   NITRITE NEGATIVE 07/15/2015 1115   LEUKOCYTESUR NEGATIVE 07/15/2015 1115   Sepsis Labs: @LABRCNTIP (procalcitonin:4,lacticacidven:4)  ) Recent Results (from the past 240 hour(s))  SARS Coronavirus 2 (  Hospital order, Performed in [facility] hospital lab)     Status: Abnormal   Collection Time: 11/12/18  6:01 AM   Specimen: Nasopharyngeal Swab  Result Value Ref Range Status   SARS Coronavirus 2 POSITIVE (A) NEGATIVE Final    Comment: RESULT CALLED TO, READ BACK BY AND VERIFIED WITH:  TERESA CLAPP AT 0732 11/12/2018 SDR (NOTE) If result is NEGATIVE SARS-CoV-2 target nucleic acids are NOT DETECTED. The SARS-CoV-2 RNA is generally detectable in upper and lower  respiratory specimens during the acute phase of infection. The lowest  concentration of SARS-CoV-2 viral copies this assay can detect is 250  copies / mL. A negative result does not preclude SARS-CoV-2 infection  and should not be used as the sole basis for treatment or other  patient management decisions.  A negative result may occur with  improper specimen collection / handling, submission of specimen other  than nasopharyngeal swab, presence of viral mutation(s) within the  areas targeted by this assay, and inadequate number of viral copies  (<250 copies / mL). A negative result must be combined with clinical  observations, patient history, and epidemiological information. If result is POSITIVE SARS-CoV-2 target nucleic acids are  DETECTED. T he SARS-CoV-2 RNA is generally detectable in upper and lower  respiratory specimens during the acute phase of infection.  Positive  results are indicative of active infection with SARS-CoV-2.  Clinical  correlation with patient history and other diagnostic information is  necessary to determine patient infection status.  Positive results do  not rule out bacterial infection or co-infection with other viruses. If result is PRESUMPTIVE POSTIVE SARS-CoV-2 nucleic acids MAY BE PRESENT.   A presumptive positive result was obtained on the submitted specimen  and confirmed on repeat testing.  While 2019 novel coronavirus  (SARS-CoV-2) nucleic acids may be present in the submitted sample  additional confirmatory testing may be necessary for epidemiological  and / or clinical management purposes  to differentiate between  SARS-CoV-2 and other Sarbecovirus currently known to infect humans.  If clinically indicated additional testing with an alternate test  methodology [phone]) is  advised. The SARS-CoV-2 RNA is generally  detectable in upper and lower respiratory specimens during the acute  phase of infection. The expected result is Negative. Fact Sheet for Patients:  StrictlyIdeas.no Fact Sheet for Healthcare Providers: BankingDealers.co.za This test is not yet approved or cleared by the [place] FDA and has been authorized for detection and/or diagnosis of SARS-CoV-2 by FDA under an Emergency Use Authorization (EUA).  This EUA will remain in effect (meaning this test can be used) for the duration of the COVID-19 declaration under Section 564(b)(1) of the Act, 21 U.S.C. section 360bbb-3(b)(1), unless the authorization is terminated or revoked sooner. Performed at [facility], [address] [place], [place] [address]   Blood Culture (routine x 2)     Status: None (Preliminary result)   Collection Time: 11/12/18   6:13 AM   Specimen: BLOOD  Result Value Ref Range Status   Specimen Description BLOOD RIGHT [facility]  Final   Special Requests   Final    BOTTLES DRAWN AEROBIC AND ANAEROBIC Blood Culture adequate volume   Culture   Final    NO GROWTH 3 DAYS Performed at [facility], [address], [place],  [address]    Report Status PENDING  Incomplete  Blood Culture (routine x 2)     Status: None (Preliminary result)   Collection Time: 11/12/18  6:13 AM  Specimen: BLOOD  Result Value Ref Range Status   Specimen Description BLOOD RIGHT W[facility]ST  Final   Special Requests   Final    BOTTLES DRAWN AEROBIC AND ANAEROBIC Blood Culture adequate volume   Culture   Final    NO GROWTH 3 DAYS Performed at [facility], [address] [place], [place] [address]    Report Status PENDING  Incomplete         Radiology Studies: No results found.      Scheduled Meds: . dexamethasone  6 mg Oral Daily  . enoxaparin (LOVENOX) injection  70 mg Subcutaneous Q24H  . potassium chloride  40 mEq Oral BID  . vitamin C  500 mg Oral Daily  . zinc sulfate  220 mg Oral Daily   Continuous Infusions: . remdesivir 100 mg in NS 250 mL 100 mg (11/15/18 0958)     LOS: 3 days    Time spent: 25 minutes     Alberteen Samhristopher P Danford, MD [facility] 11/15/2018, 11:44 AM     Please page through AMION:  [email or web address] Password [email or web address] If 7PM-7AM, please contact night-coverage

## 2018-11-16 LAB — COMPREHENSIVE METABOLIC PANEL
ALT: 141 U/L — ABNORMAL HIGH (ref 0–44)
AST: 57 U/L — ABNORMAL HIGH (ref 15–41)
Albumin: 3.2 g/dL — ABNORMAL LOW (ref 3.5–5.0)
Alkaline Phosphatase: 46 U/L (ref 38–126)
Anion gap: 9 (ref 5–15)
BUN: 17 mg/dL (ref 6–20)
CO2: 28 mmol/L (ref 22–32)
Calcium: 8.9 mg/dL (ref 8.9–10.3)
Chloride: 104 mmol/L (ref 98–111)
Creatinine, Ser: 0.66 mg/dL (ref 0.61–1.24)
GFR calc Af Amer: >60 mL/min (ref >60)
GFR calc non Af Amer: >60 mL/min (ref >60)
Glucose, Bld: 125 mg/dL — ABNORMAL HIGH (ref 70–99)
Potassium: 3.9 mmol/L (ref 3.5–5.1)
Sodium: 141 mmol/L (ref 135–145)
Total Bilirubin: 0.5 mg/dL (ref 0.3–1.2)
Total Protein: 7.1 g/dL (ref 6.5–8.1)

## 2018-11-16 LAB — CBC WITH DIFFERENTIAL/PLATELET
Abs Immature Granulocytes: 0.14 10*3/uL — ABNORMAL HIGH (ref 0.00–0.07)
Basophils Absolute: 0.1 10*3/uL (ref 0.0–0.1)
Basophils Relative: 1 %
Eosinophils Absolute: 0 10*3/uL (ref 0.0–0.5)
Eosinophils Relative: 0 %
HCT: 44.4 % (ref 39.0–52.0)
Hemoglobin: 14.7 g/dL (ref 13.0–17.0)
Immature Granulocytes: 2 %
Lymphocytes Relative: 16 %
Lymphs Abs: 1.2 10*3/uL (ref 0.7–4.0)
MCH: 28.5 pg (ref 26.0–34.0)
MCHC: 33.1 g/dL (ref 30.0–36.0)
MCV: 86 fL (ref 80.0–100.0)
Monocytes Absolute: 0.6 10*3/uL (ref 0.1–1.0)
Monocytes Relative: 8 %
Neutro Abs: 5.6 10*3/uL (ref 1.7–7.7)
Neutrophils Relative %: 73 %
Platelets: 348 10*3/uL (ref 150–400)
RBC: 5.16 MIL/uL (ref 4.22–5.81)
RDW: 11.9 % (ref 11.5–15.5)
WBC: 7.6 10*3/uL (ref 4.0–10.5)
nRBC: 0.3 % — ABNORMAL HIGH (ref 0.0–0.2)

## 2018-11-16 LAB — FERRITIN: Ferritin: 614 ng/mL — ABNORMAL HIGH (ref 24–336)

## 2018-11-16 LAB — D-DIMER, QUANTITATIVE: D-Dimer, Quant: 0.35 ug/mL-FEU (ref 0.00–0.50)

## 2018-11-16 LAB — C-REACTIVE PROTEIN: CRP: 1 mg/dL — ABNORMAL HIGH (ref <1.0)

## 2018-11-16 NOTE — Progress Notes (Signed)
O2 sats when standing 88% with 2L.. O2 sats while ambulating with 2L - 91%. Dyspnea with exertion.

## 2018-11-16 NOTE — Plan of Care (Signed)

## 2018-11-16 NOTE — Progress Notes (Signed)
PROGRESS NOTE    [name]  LEX:[id number] DOB: [birth year] DOA: 11/12/2018 PCP: [facility]      Brief Narrative:  Dustin Mccann is a 28 y.o. M with hx obesity who presents with 1 week cough, fever, now progressive dyspnea on exertion, dizziness, hemoptysis.  The patient was at usual state of health until 6/9 when he developed cough, fever, body aches.  His mother had developed symptoms around the same time.  On Mon 3 days PTA, he went to [facility] ER where he tested positive for SARS-CoV-2, had developing pneumonia on chest x-ray, but was stable on room air and was discharged the next day.    Over the next 3 days, he is started to feel more dyspneic with exertion, chest discomfort, and dizziness with ambulation.  He has had continued fevers and cough productive of once bloody sputum.   At [facility], he had bilateral pneumonia on chest x-ray, was hypoxic to 87% on room air.  Had lymphopenia and transaminitis.  Had newly elevated d-dimer.  Lactate, renal function, procalcitonin all normal.  He was given Decadron and transferred to [facility].        Assessment & Plan:  Coronavirus pneumonitis with acute hypoxic respiratory failure In setting of ongoing 2020 COVID-19 pandemic.  CRP trend down, still on 2L O2.     -Continue remdesivir, day 4 of 5 -Continue steroids -Wean O2 as able -Continue VTE PPx with Lovenox -Continue Zinc and Vitamin C -Daily d-dimer, ferritin and CRP  COVID-19 Labs Recent Labs    11/14/18 0226 11/15/18 0237 11/16/18 0433  DDIMER 0.29 0.27 0.35  FERRITIN 623* 573* 614*  CRP 6.1* 3.1* 1.0*      Hypokalemia Mag normal, K normal  Transaminitis Slightly up today.  HIV neg, hep serologies pending -Close monitoring LFTs while on remdesivir -Follow viral serologies      MDM and disposition: Labs and imaging reports were reviewed and summarized above.  Medication management as above.  The patient was  admitted with acute hypoxic respiratory failure from COVID-19.  This is slowly improving, although he still requires 2 L of oxygen at all times, and is dyspneic with exertion.  We will continue remdesivir, hopefully home in 2 to 3 days            DVT prophylaxis: Lovenox Code Status: FULL Family Communication:      Consultants:   None  Procedures:   CTA chest  Antimicrobials:   Ceftriaxone x1 6/18  Azithromycin x1 6/18   Culture data:   6/18 blood culture x2 --no growth       Subjective: No fever overnight.  Still with some dyspnea with exertion.  Chest discomfort with exertion.  Diarrhea resolved.  No sputum production, vomiting, confusion.    Objective: Vitals:   11/15/18 1547 11/15/18 2009 11/16/18 0307 11/16/18 0820  BP: 109/70 116/78 (!) 119/57   Pulse: 80 65 71   Resp: 16     Temp: 98.3 F (36.8 C) 98.4 F (36.9 C) 98.6 F (37 C) 98.3 F (36.8 C)  TempSrc: Oral Oral Oral   SpO2: 94% 96% 93%   Weight:      Height:        Intake/Output Summary (Last 24 hours) at 11/16/2018 1040 Last data filed at 11/16/2018 1013 Gross per 24 hour  Intake 970 ml  Output -  Net 970 ml   Filed Weights   11/15/18 1020  Weight: (!) 141.4 kg  Examination: General appearance: Obese adult male, sitting in recliner, no acute distress.  Appears tired. HEENT: Anicteric, conjunctiva pink, lids and lashes normal. No nasal deformity, discharge, epistaxis.  Lips moist, dentition normal, OP moist, no oral lesions, hearing normal.   Skin: Warm and dry.  no jaundice.  No suspicious rashes or lesions. Cardiac: Heart rate regular, no murmurs, JVP not visible, no lower extremity edema Respiratory: Respiratory effort normal but increased rate.  Lung sounds diminished throughout.  No wheezing. Abdomen: Abdomen soft without tenderness to palpation. MSK: No deformities or effusions. Neuro: Awake and alert, extraocular movements intact, moves all extremities with normal  strength and coordination, speech fluent. Psych: Sensorium intact and responding to questions, attention normal, judgment normal, affect normal.      Data Reviewed: I have personally reviewed following labs and imaging studies:  CBC: Recent Labs  Lab 11/12/18 0601 11/13/18 0300 11/14/18 0226 11/15/18 0237 11/16/18 0433  WBC 4.4 8.7 9.2 5.9 7.6  NEUTROABS 3.5 7.3 7.8* 4.5 5.6  HGB 15.3 15.1 13.6 13.5 14.7  HCT 44.4 44.4 41.7 41.2 44.4  MCV 82.7 85.2 86.5 86.6 86.0  PLT 145* 185 218 256 348   Basic Metabolic Panel: Recent Labs  Lab 11/12/18 0601 11/13/18 0300 11/14/18 0226 11/15/18 0237 11/16/18 0433  NA 135 138 141 140 141  K 3.2* 3.4* 3.4* 3.9 3.9  CL 100 103 103 105 104  CO2 21* 25 26 27 28   GLUCOSE 146* 165* 126* 150* 125*  BUN 13 16 15 17 17   CREATININE 0.81 0.72 0.73 0.67 0.66  CALCIUM 8.6* 8.8* 8.6* 8.7* 8.9  MG  --  2.1  --   --   --    GFR: Estimated Creatinine Clearance: 194.9 mL/min (by C-G formula based on SCr of 0.66 mg/dL). Liver Function Tests: Recent Labs  Lab 11/12/18 0601 11/13/18 0300 11/14/18 0226 11/15/18 0237 11/16/18 0433  AST 52* 42* 44* 30 57*  ALT 54* 54* 59* 63* 141*  ALKPHOS 55 55 49 43 46  BILITOT 1.0 0.3 0.6 0.2* 0.5  PROT 8.3* 7.9 7.7 7.2 7.1  ALBUMIN 3.7 3.5 3.3* 3.2* 3.2*   No results for input(s): LIPASE, AMYLASE in the last 168 hours. No results for input(s): AMMONIA in the last 168 hours. Coagulation Profile: No results for input(s): INR, PROTIME in the last 168 hours. Cardiac Enzymes: Recent Labs  Lab 11/13/18 0300  TROPONINI <0.03   BNP (last 3 results) No results for input(s): PROBNP in the last 8760 hours. HbA1C: No results for input(s): HGBA1C in the last 72 hours. CBG: No results for input(s): GLUCAP in the last 168 hours. Lipid Profile: No results for input(s): CHOL, HDL, LDLCALC, TRIG, CHOLHDL, LDLDIRECT in the last 72 hours. Thyroid Function Tests: No results for input(s): TSH, T4TOTAL, FREET4,  T3FREE, THYROIDAB in the last 72 hours. Anemia Panel: Recent Labs    11/15/18 0237 11/16/18 0433  FERRITIN 573* 614*   Urine analysis:    Component Value Date/Time   COLORURINE YELLOW (A) 07/15/2015 1115   APPEARANCEUR CLEAR (A) 07/15/2015 1115   LABSPEC 1.035 (H) 07/15/2015 1115   PHURINE 5.0 07/15/2015 1115   GLUCOSEU NEGATIVE 07/15/2015 1115   HGBUR NEGATIVE 07/15/2015 1115   BILIRUBINUR NEGATIVE 07/15/2015 1115   KETONESUR NEGATIVE 07/15/2015 1115   PROTEINUR 30 (A) 07/15/2015 1115   NITRITE NEGATIVE 07/15/2015 1115   LEUKOCYTESUR NEGATIVE 07/15/2015 1115   Sepsis Labs: @LABRCNTIP (procalcitonin:4,lacticacidven:4)  ) Recent Results (from the past 240 hour(s))  SARS Coronavirus 2 [facility](Hospital  order, Performed in [facility] hospital lab)     Status: Abnormal   Collection Time: 11/12/18  6:01 AM   Specimen: Nasopharyngeal Swab  Result Value Ref Range Status   SARS Coronavirus 2 POSITIVE (A) NEGATIVE Final    Comment: RESULT CALLED TO, READ BACK BY AND VERIFIED WITH:  TERESA CLAPP AT 0732 11/12/2018 SDR (NOTE) If result is NEGATIVE SARS-CoV-2 target nucleic acids are NOT DETECTED. The SARS-CoV-2 RNA is generally detectable in upper and lower  respiratory specimens during the acute phase of infection. The lowest  concentration of SARS-CoV-2 viral copies this assay can detect is 250  copies / mL. A negative result does not preclude SARS-CoV-2 infection  and should not be used as the sole basis for treatment or other  patient management decisions.  A negative result may occur with  improper specimen collection / handling, submission of specimen other  than nasopharyngeal swab, presence of viral mutation(s) within the  areas targeted by this assay, and inadequate number of viral copies  (<250 copies / mL). A negative result must be combined with clinical  observations, patient history, and epidemiological information. If result is POSITIVE SARS-CoV-2 target nucleic acids are  DETECTED. T he SARS-CoV-2 RNA is generally detectable in upper and lower  respiratory specimens during the acute phase of infection.  Positive  results are indicative of active infection with SARS-CoV-2.  Clinical  correlation with patient history and other diagnostic information is  necessary to determine patient infection status.  Positive results do  not rule out bacterial infection or co-infection with other viruses. If result is PRESUMPTIVE POSTIVE SARS-CoV-2 nucleic acids MAY BE PRESENT.   A presumptive positive result was obtained on the submitted specimen  and confirmed on repeat testing.  While 2019 novel coronavirus  (SARS-CoV-2) nucleic acids may be present in the submitted sample  additional confirmatory testing may be necessary for epidemiological  and / or clinical management purposes  to differentiate between  SARS-CoV-2 and other Sarbecovirus currently known to infect humans.  If clinically indicated additional testing with an alternate test  methodology [phone]([id number]) is  advised. The SARS-CoV-2 RNA is generally  detectable in upper and lower respiratory specimens during the acute  phase of infection. The expected result is Negative. Fact Sheet for Patients:  BoilerBrush.com.cyhttps://www.fda.gov/media/136312/download Fact Sheet for Healthcare Providers: https://pope.com/https://www.fda.gov/media/136313/download This test is not yet approved or cleared by the [place] FDA and has been authorized for detection and/or diagnosis of SARS-CoV-2 by FDA under an Emergency Use Authorization (EUA).  This EUA will remain in effect (meaning this test can be used) for the duration of the COVID-19 declaration under Section 564(b)(1) of the Act, 21 U.S.C. section 360bbb-3(b)(1), unless the authorization is terminated or revoked sooner. Performed at [facility], [address] [place], [place] [address]   Blood Culture (routine x 2)     Status: None (Preliminary result)   Collection Time: 11/12/18   6:13 AM   Specimen: BLOOD  Result Value Ref Range Status   Specimen Description BLOOD RIGHT [facility]  Final   Special Requests   Final    BOTTLES DRAWN AEROBIC AND ANAEROBIC Blood Culture adequate volume   Culture   Final    NO GROWTH 4 DAYS Performed at [facility], [address] [place], [place] [address]    Report Status PENDING  Incomplete  Blood Culture (routine x 2)     Status: None (Preliminary result)   Collection Time: 11/12/18  6:13 AM   Specimen:  BLOOD  Result Value Ref Range Status   Specimen Description BLOOD RIGHT WRIST  Final   Special Requests   Final    BOTTLES DRAWN AEROBIC AND ANAEROBIC Blood Culture adequate volume   Culture   Final    NO GROWTH 4 DAYS Performed at [facility], [address] [place], [place] [address]    Report Status PENDING  Incomplete         Radiology Studies: No results found.      Scheduled Meds: . dexamethasone  6 mg Oral Daily  . enoxaparin (LOVENOX) injection  70 mg Subcutaneous Q24H  . potassium chloride  40 mEq Oral BID  . vitamin C  500 mg Oral Daily  . zinc sulfate  220 mg Oral Daily   Continuous Infusions: . remdesivir 100 mg in NS 250 mL 100 mg (11/16/18 0953)     LOS: 4 days    Time spent: 25 minutes     Alberteen Samhristopher P Shatavia Santor, MD [facility] 11/16/2018, 10:40 AM     Please page through AMION:  [email or web address] Password [email or web address] If 7PM-7AM, please contact night-coverage

## 2018-11-17 LAB — CBC WITH DIFFERENTIAL/PLATELET
Abs Immature Granulocytes: 0.31 10*3/uL — ABNORMAL HIGH (ref 0.00–0.07)
Basophils Absolute: 0 10*3/uL (ref 0.0–0.1)
Basophils Relative: 1 %
Eosinophils Absolute: 0 10*3/uL (ref 0.0–0.5)
Eosinophils Relative: 0 %
HCT: 45.1 % (ref 39.0–52.0)
Hemoglobin: 14.7 g/dL (ref 13.0–17.0)
Immature Granulocytes: 4 %
Lymphocytes Relative: 15 %
Lymphs Abs: 1.3 10*3/uL (ref 0.7–4.0)
MCH: 27.9 pg (ref 26.0–34.0)
MCHC: 32.6 g/dL (ref 30.0–36.0)
MCV: 85.7 fL (ref 80.0–100.0)
Monocytes Absolute: 0.8 10*3/uL (ref 0.1–1.0)
Monocytes Relative: 9 %
Neutro Abs: 6.4 10*3/uL (ref 1.7–7.7)
Neutrophils Relative %: 71 %
Platelets: 396 10*3/uL (ref 150–400)
RBC: 5.26 MIL/uL (ref 4.22–5.81)
RDW: 11.7 % (ref 11.5–15.5)
WBC: 8.8 10*3/uL (ref 4.0–10.5)
nRBC: 0.2 % (ref 0.0–0.2)

## 2018-11-17 LAB — COMPREHENSIVE METABOLIC PANEL
ALT: 139 U/L — ABNORMAL HIGH (ref 0–44)
AST: 39 U/L (ref 15–41)
Albumin: 3.3 g/dL — ABNORMAL LOW (ref 3.5–5.0)
Alkaline Phosphatase: 52 U/L (ref 38–126)
Anion gap: 13 (ref 5–15)
BUN: 16 mg/dL (ref 6–20)
CO2: 24 mmol/L (ref 22–32)
Calcium: 8.6 mg/dL — ABNORMAL LOW (ref 8.9–10.3)
Chloride: 101 mmol/L (ref 98–111)
Creatinine, Ser: 0.66 mg/dL (ref 0.61–1.24)
GFR calc Af Amer: >60 mL/min (ref >60)
GFR calc non Af Amer: >60 mL/min (ref >60)
Glucose, Bld: 111 mg/dL — ABNORMAL HIGH (ref 70–99)
Potassium: 4 mmol/L (ref 3.5–5.1)
Sodium: 138 mmol/L (ref 135–145)
Total Bilirubin: 0.4 mg/dL (ref 0.3–1.2)
Total Protein: 7 g/dL (ref 6.5–8.1)

## 2018-11-17 LAB — CULTURE, BLOOD (ROUTINE X 2)
Culture: NO GROWTH
Culture: NO GROWTH
Special Requests: ADEQUATE
Special Requests: ADEQUATE

## 2018-11-17 LAB — C-REACTIVE PROTEIN: CRP: <0.8 mg/dL (ref <1.0)

## 2018-11-17 LAB — HEPATITIS PANEL, ACUTE
HCV Ab: <0.1 s/co ratio (ref 0.0–0.9)
Hep A IgM: NEGATIVE
Hep B C IgM: NEGATIVE
Hepatitis B Surface Ag: NEGATIVE

## 2018-11-17 LAB — FERRITIN: Ferritin: 490 ng/mL — ABNORMAL HIGH (ref 24–336)

## 2018-11-17 LAB — D-DIMER, QUANTITATIVE: D-Dimer, Quant: 0.28 ug/mL-FEU (ref 0.00–0.50)

## 2018-11-17 MED ORDER — GUAIFENESIN-DM 100-10 MG/5ML PO SYRP: 10.0000 mL | ORAL_SOLUTION | ORAL | 0 refills | Status: AC | PRN

## 2018-11-17 MED ORDER — LOPERAMIDE HCL 2 MG PO CAPS: 2.0000 mg | ORAL_CAPSULE | Freq: Four times a day (QID) | ORAL | 0 refills | Status: AC | PRN

## 2018-11-17 MED ORDER — DEXAMETHASONE 6 MG PO TABS: 6.0000 mg | ORAL_TABLET | Freq: Every day | ORAL | 0 refills | Status: AC

## 2018-11-17 NOTE — TOC Initial Note (Signed)
Transition of Care [facility]) - Initial/Assessment Note    Patient Details  Name: Dustin Mccann MRN: [id number] Date of Birth: Dec 13, 1990  Transition [facility]) CM/SW Contact:    Purcell Mouton, RN Phone Number: 11/17/2018, 11:16 AM  Clinical Narrative:                 Pt tested COVID positive on admission 6/18. Plan to discharge home with O2 from [facility].   Expected Discharge Plan: Home/Self Care Barriers to Discharge: No Barriers Identified   Patient Goals and CMS Choice Patient states their goals for this hospitalization and ongoing recovery are:: Home      Expected Discharge Plan and Services Expected Discharge Plan: Home/Self Care   Discharge Planning Services: CM Consult   Living arrangements for the past 2 months: Single Family Home Expected Discharge Date: 11/17/18               DME Arranged: Oxygen DME Agency: [place] Date DME Agency Contacted: 11/17/18 Time DME Agency Contacted: [phone] Representative spoke with at DME Agency: Magda Paganini            Prior Living Arrangements/Services Living arrangements for the past 2 months: [place] Lives with:: Parents Patient language and need for interpreter reviewed:: Yes(Spanish) Do you feel safe going back to the place where you live?: Yes               Activities of Daily Living Home Assistive Devices/Equipment: None ADL Screening (condition at time of admission) Patient's cognitive ability adequate to safely complete daily activities?: Yes Is the patient deaf or have difficulty hearing?: No Does the patient have difficulty seeing, even when wearing glasses/contacts?: No Does the patient have difficulty concentrating, remembering, or making decisions?: No Patient able to express need for assistance with ADLs?: Yes Does the patient have difficulty dressing or bathing?: Yes(Acute - SOB) Independently performs ADLs?: No Communication: Independent Dressing (OT): Needs assistance Is this a  change from baseline?: Change from baseline, expected to last <3days Grooming: Needs assistance Is this a change from baseline?: Change from baseline, expected to last <3 days Feeding: Independent Bathing: Needs assistance Is this a change from baseline?: Change from baseline, expected to last <3 days Toileting: Needs assistance Is this a change from baseline?: Change from baseline, expected to last <3 days In/Out Bed: Needs assistance Is this a change from baseline?: Change from baseline, expected to last <3 days Does the patient have difficulty walking or climbing stairs?: No(Not at baseline) Weakness of Legs: Both Weakness of Arms/Hands: Both  Permission Sought/Granted                  Emotional Assessment              Admission diagnosis:  COVID-19 virus detected [U07.1] Patient Active Problem List   Diagnosis Date Noted  . COVID-19 virus detected 11/12/2018  . Acute respiratory failure with hypoxemia ([place]) 11/12/2018  . Pneumonia due to COVID-19 virus 11/12/2018   PCP:  [facility]:   [id number] - [place], [place] - [address] [place] [address] Phone: [phone] Fax: [phone]     Social Determinants of Health (SDOH) Interventions    Readmission Risk Interventions No flowsheet data found.

## 2018-11-17 NOTE — Progress Notes (Signed)
O2 sats at rest without O2 - 94%  O2 sats while ambulating without O2 - 92%  O2 sats while ambulating with O2 2L - 96%

## 2018-11-17 NOTE — Discharge Summary (Signed)
Physician Discharge Summary  Dustin RobertsonDaniel Sosa Mccann EAV:[id number] DOB: [birth year] DOA: 11/12/2018  [place]  Admit date: 11/12/2018 Discharge date: 11/17/2018  Admitted From: Home  Disposition:  Home   Recommendations for Outpatient Follow-up:  1. Follow up with PCP in 1 week 2. Please assist patient to wean O2 3. Please repeat LFTs in 4 weeks     Home Health: None  Equipment/Devices: O2  Discharge Condition: Fair  CODE STATUS: FULL Diet recommendation: Regular  Brief/Interim Summary: Dustin PumaDaniel Sosa Jacobois a 28 y.o.Mwith hx obesitywho presents with 1 week cough, fever, now progressive dyspnea on exertion, dizziness, hemoptysis.  The patient was at usual state of health until 6/9 when he developed cough, fever, body aches. His mother had developed symptoms around the same time. On Mon 3 days PTA, [facility] where he tested positive for SARS-CoV-2, had developing pneumonia on chest x-ray, but was stable on room air and was discharged the next day.   Over the next 3 days, he is started to feel more dyspneic with exertion, chest discomfort, and dizziness withambulation.He has had continued fevers and cough productive of once bloody sputum.  At [facility], he had bilateral pneumonia on chest x-ray, was hypoxic to 87% on room air. Had lymphopenia and transaminitis. Had newly elevated d-dimer. Lactate, renal function, procalcitonin all normal.  He was given Decadron and [facility].     PRINCIPAL HOSPITAL DIAGNOSIS: COVID-19    Discharge Diagnoses:   Coronavirus pneumonitis with acute hypoxic respiratory failure In setting of ongoing 2020 COVID-19 pandemic.  Admitted, CXR with bilateral opacities, CTA negative for PE, and hypoxic requiring 2L O2.  Started on remdesivir and dexamethasone.  Treated for 5 days remdesivir.    O2 needs remained stable at 2L and patient's dyspnea and chest tightness with exertion waned  daily.  After completing remdesivir, he was discharged to complete 10 days dexamethasone and with instructions for close PCP follow up.      Hypokalemia Resolved  Transaminitis HIV neg, hep serologies negative.  Repeat LFTs in 1 month.             Discharge Instructions  Discharge Instructions    Discharge instructions   Complete by: As directed    You were admitted for coronavirus (Also known as COVID-19)  You were treated with steroids and remdesivir.  You completed the course of remdesivir (a medicine to prevent coronavirus from reproducing) You should finish the course of steroids by taking dexamethasone 6 mg once daily in the morning for 4 more days  If you have any lingering cough, you should take the cough syrup we gave you here, Robitussin (with the ingredients "GUIAFENESIN" and "DEXTROMETHORPHAN")  Use the oxygen all the time until you see your primary care doctor. The purpose of the  oxygen is to keep your oxygen level at 88% or above Use a pulse oximeter to measure your oxygen level.  If the home health agency doesn't bring you one of these, you can find them at any drug store.  HOW LONG TO REMAIN IN QUARANTINE: There is some uncertainty about this. I recommend you self isolate for at least 3 more days, longer if instructed by the [facility]. The CDC and our local health departments recommend you isolate strictly until 10 days from your first symptoms AND at least 3 days from your last fever (your last fever here was on June 18)   In the end, the [facility]  Department have the sole discretion to judge when you may leave your house or return to work.   You have no personal health conditions that require special self-isolation precautions. The [facility] Department can be reached at [phone]   Until the health department has cleared you to end your quarantine: If you have anyone in the home who has NOT had  coronavirus:    -do not be in the same room with them until your self isolation is over    -WEAR A MASK and have them wear a mask if you MUST be in the same room    -clean all hard surfaces (counters, doors, tables) twice a day    -use a separate bathroom at all times    Call [facility] for a follow up appointment in 1-2 weeks to discuss stopping the oxygen   Increase activity slowly   Complete by: As directed    MyChart COVID-19 home monitoring program   Complete by: Nov 17, 2018    Is the patient willing to use the MyChart Mobile App for home monitoring?: Yes   Temperature monitoring   Complete by: Nov 17, 2018    After how many days would you like to receive a notification of this patient's flowsheet entries?: 1     Allergies as of 11/17/2018   No Known Allergies     Medication List    TAKE these medications   dexamethasone 6 MG tablet Commonly known as: DECADRON Take 1 tablet (6 mg total) by mouth daily. Start taking on: November 18, 2018   guaiFENesin-dextromethorphan 100-10 MG/5ML syrup Commonly known as: ROBITUSSIN DM Take 10 mLs by mouth every 4 (four) hours as needed for cough.   loperamide 2 MG capsule Commonly known as: IMODIUM Take 1 capsule (2 mg total) by mouth every 6 (six) hours as needed for diarrhea or loose stools.            Durable Medical Equipment  (From admission, onward)         Start     Ordered   11/17/18 0726  DME Oxygen  Once    Question Answer Comment  Length of Need 6 Months   Mode or (Route) Nasal cannula   Liters per Minute 2   Frequency Continuous (stationary and portable oxygen unit needed)   Oxygen conserving device Yes   Oxygen delivery system Gas      11/17/18 0726         Follow-up Information    [place], [facility] Follow up.   Why: Call them today or tomorrow to arrange for phone follow up in 1 week Contact information: [address] [place] [address] [place] [address] [phone]          No Known  Allergies  Consultations:  None   Procedures/Studies: Ct Angio Chest Pe W Or Wo Contrast  Addendum Date: 11/13/2018   ADDENDUM REPORT: 11/13/2018 18:58 ADDENDUM: This addendum is created to disregard this report. This is a duplicate accession, the report has been removed to accession [id number]. Electronically Signed   By: Narda Rutherford M.D.   On: 11/13/2018 18:58   Result Date: 11/13/2018 CLINICAL DATA:  PE suspected, intermediate prob, positive D-dimer. Progressive shortness of breath and chest pain. COVID-19 positive. EXAM: CT ANGIOGRAPHY CHEST WITH CONTRAST TECHNIQUE: Multidetector CT imaging of the chest was performed using the standard protocol during bolus administration of intravenous contrast. Multiplanar CT image reconstructions and MIPs were obtained to evaluate the vascular anatomy. CONTRAST:  100mL OMNIPAQUE IOHEXOL 350 MG/ML SOLN COMPARISON:  Chest radiograph earlier this day. FINDINGS: Cardiovascular: Examination is technically limited due to soft tissue attenuation from habitus, breathing motion artifact, contrast bolus timing. Allowing for this, no filling defects in the pulmonary arteries to suggest pulmonary embolus. Examination is diagnostic to the level of the proximal segmental level. Thoracic aorta is normal in caliber without dissection. Heart is normal in size. No pericardial effusion. Mediastinum/Nodes: Small mediastinal nodes not enlarged by size criteria. The esophagus is decompressed. Thyroid gland is unremarkable. Lungs/Pleura: Multifocal pulmonary opacities with ground-glass and confluent opacities in both lungs. Confluent dependent opacity in the right lower lobe is most prominent. There is central air bronchograms. No pleural fluid. Upper Abdomen: Suspected hepatic steatosis. No acute findings. Musculoskeletal: There are no acute or suspicious osseous abnormalities. Review of the MIP images confirms the above findings. IMPRESSION: 1. No central pulmonary embolus,  branches distal to the segmental level are not well assessed due to breathing motion, soft tissue attenuation from habitus and contrast bolus timing. 2. Multifocal pneumonia in a pattern consistent with COVID-19 infection. Confluent ground-glass opacities throughout both lungs, most prominent in the right lower lobe. Electronically Signed: By: Narda RutherfordMelanie  Sanford M.D. On: 11/12/2018 22:24   Ct Angio Chest Pe W Or Wo Contrast  Result Date: 11/13/2018 CLINICAL DATA:  PE suspected, intermediate prob, positive D-dimer. Progressive shortness of breath and chest pain. COVID-19 positive. EXAM: CT ANGIOGRAPHY CHEST WITH CONTRAST TECHNIQUE: Multidetector CT imaging of the chest was performed using the standard protocol during bolus administration of intravenous contrast. Multiplanar CT image reconstructions and MIPs were obtained to evaluate the vascular anatomy. CONTRAST:  100mL OMNIPAQUE IOHEXOL 350 MG/ML SOLN COMPARISON:  Chest radiograph earlier this day. FINDINGS: Cardiovascular: Examination is technically limited due to soft tissue attenuation from habitus, breathing motion artifact, contrast bolus timing. Allowing for this, no filling defects in the pulmonary arteries to suggest pulmonary embolus. Examination is diagnostic to the level of the proximal segmental level. Thoracic aorta is normal in caliber without dissection. Heart is normal in size. No pericardial effusion. Mediastinum/Nodes: Small mediastinal nodes not enlarged by size criteria. The esophagus is decompressed. Thyroid gland is unremarkable. Lungs/Pleura: Multifocal pulmonary opacities with ground-glass and confluent opacities in both lungs. Confluent dependent opacity in the right lower lobe is most prominent. There is central air bronchograms. No pleural fluid. Upper Abdomen: Suspected hepatic steatosis. No acute findings. Musculoskeletal: There are no acute or suspicious osseous abnormalities. Review of the MIP images confirms the above findings.  IMPRESSION: 1. No central pulmonary embolus, branches distal to the segmental level are not well assessed due to breathing motion, soft tissue attenuation from habitus and contrast bolus timing. 2. Multifocal pneumonia in a pattern consistent with COVID-19 infection. Confluent ground-glass opacities throughout both lungs, most prominent in the right lower lobe. Electronically Signed   By: Narda RutherfordMelanie  Sanford M.D.   On: 11/12/2018 22:24   Dg Chest Port 1 View  Result Date: 11/12/2018 CLINICAL DATA:  Shortness of breath, COVID positive EXAM: PORTABLE CHEST 1 VIEW COMPARISON:  None. FINDINGS: Multifocal patchy opacities in the bilateral upper lobes and left lower lobe, compatible with multifocal pneumonia. No pleural effusion or pneumothorax. Mild eventration of the right hemidiaphragm. The heart is normal in size. IMPRESSION: Multifocal pneumonia, as above. Electronically Signed   By: Charline BillsSriyesh  Krishnan M.D.   On: 11/12/2018 07:02       Subjective: Feeling well.  Mildly dyspneic with ambualtion, able to circle unit.  No fever, confusion.  No  sputum.  Discharge Exam: Vitals:   11/17/18 0710 11/17/18 0949  BP: 103/67   Pulse: 69   Resp: 20   Temp: 98 F (36.7 C)   SpO2: 95% 94%   Vitals:   11/16/18 2001 11/17/18 0400 11/17/18 0710 11/17/18 0949  BP: 104/63 100/70 103/67   Pulse: 76 68 69   Resp:  20 20   Temp: 97.7 F (36.5 C) 98.1 F (36.7 C) 98 F (36.7 C)   TempSrc: Oral Oral    SpO2: 94%  95% 94%  Weight:      Height:        General: Pt is alert, awake, not in acute distress Cardiovascular: RRR, nl S1-S2, no murmurs appreciated.   No LE edema.   Respiratory: Normal respiratory rate and rhythm.  CTAB without rales or wheezes. Abdominal: Abdomen soft and non-tender.  No distension or HSM.   Neuro/Psych: Strength symmetric in upper and lower extremities.  Judgment and insight appear normal.   The results of significant diagnostics from this hospitalization (including imaging,  microbiology, ancillary and laboratory) are listed below for reference.     Microbiology: Recent Results (from the past 240 hour(s))  SARS Coronavirus 2 [facility](Hospital order, Performed in [facility] hospital lab)     Status: Abnormal   Collection Time: 11/12/18  6:01 AM   Specimen: Nasopharyngeal Swab  Result Value Ref Range Status   SARS Coronavirus 2 POSITIVE (A) NEGATIVE Final    Comment: RESULT CALLED TO, READ BACK BY AND VERIFIED WITH:  TERESA CLAPP AT 0732 11/12/2018 SDR (NOTE) If result is NEGATIVE SARS-CoV-2 target nucleic acids are NOT DETECTED. The SARS-CoV-2 RNA is generally detectable in upper and lower  respiratory specimens during the acute phase of infection. The lowest  concentration of SARS-CoV-2 viral copies this assay can detect is 250  copies / mL. A negative result does not preclude SARS-CoV-2 infection  and should not be used as the sole basis for treatment or other  patient management decisions.  A negative result may occur with  improper specimen collection / handling, submission of specimen other  than nasopharyngeal swab, presence of viral mutation(s) within the  areas targeted by this assay, and inadequate number of viral copies  (<250 copies / mL). A negative result must be combined with clinical  observations, patient history, and epidemiological information. If result is POSITIVE SARS-CoV-2 target nucleic acids are DETECTED. T he SARS-CoV-2 RNA is generally detectable in upper and lower  respiratory specimens during the acute phase of infection.  Positive  results are indicative of active infection with SARS-CoV-2.  Clinical  correlation with patient history and other diagnostic information is  necessary to determine patient infection status.  Positive results do  not rule out bacterial infection or co-infection with other viruses. If result is PRESUMPTIVE POSTIVE SARS-CoV-2 nucleic acids MAY BE PRESENT.   A presumptive positive result was obtained on the  submitted specimen  and confirmed on repeat testing.  While 2019 novel coronavirus  (SARS-CoV-2) nucleic acids may be present in the submitted sample  additional confirmatory testing may be necessary for epidemiological  and / or clinical management purposes  to differentiate between  SARS-CoV-2 and other Sarbecovirus currently known to infect humans.  If clinically indicated additional testing with an alternate test  methodology [phone]) is  advised. The SARS-CoV-2 RNA is generally  detectable in upper and lower respiratory specimens during the acute  phase of infection. The expected result is Negative. Fact Sheet for Patients:  BoilerBrush.com.cyhttps://www.fda.gov/media/136312/download  Fact Sheet for Healthcare Providers: https://pope.com/ This test is not yet approved or cleared by the [place] FDA and has been authorized for detection and/or diagnosis of SARS-CoV-2 by FDA under an Emergency Use Authorization (EUA).  This EUA will remain in effect (meaning this test can be used) for the duration of the COVID-19 declaration under Section 564(b)(1) of the Act, 21 U.S.C. section 360bbb-3(b)(1), unless the authorization is terminated or revoked sooner. Performed at [facility], [address] [place], [place] [address]   Blood Culture (routine x 2)     Status: None   Collection Time: 11/12/18  6:13 AM   Specimen: BLOOD  Result Value Ref Range Status   Specimen Description BLOOD RIGHT [facility]  Final   Special Requests   Final    BOTTLES DRAWN AEROBIC AND ANAEROBIC Blood Culture adequate volume   Culture   Final    NO GROWTH 5 DAYS Performed at [facility], [address] [place], [place] [address]    Report Status 11/17/2018 FINAL  Final  Blood Culture (routine x 2)     Status: None   Collection Time: 11/12/18  6:13 AM   Specimen: BLOOD  Result Value Ref Range Status   Specimen Description BLOOD RIGHT [facility]  Final   Special Requests   Final     BOTTLES DRAWN AEROBIC AND ANAEROBIC Blood Culture adequate volume   Culture   Final    NO GROWTH 5 DAYS Performed at [facility], [address] [place], [place] [address]    Report Status 11/17/2018 FINAL  Final     Labs: BNP (last 3 results) No results for input(s): BNP in the last 8760 hours. Basic Metabolic Panel: Recent Labs  Lab 11/13/18 0300 11/14/18 0226 11/15/18 0237 11/16/18 0433 11/17/18 0249  NA 138 141 140 141 138  K 3.4* 3.4* 3.9 3.9 4.0  CL 103 103 105 104 101  CO2 GLUCOSE 165* 126* 150* 125* 111*  BUN CREATININE 0.72 0.73 0.67 0.66 0.66  CALCIUM 8.8* 8.6* 8.7* 8.9 8.6*  MG 2.1  --   --   --   --    Liver Function Tests: Recent Labs  Lab 11/13/18 0300 11/14/18 0226 11/15/18 0237 11/16/18 0433 11/17/18 0249  AST 42* 44* 30 57* 39  ALT 54* 59* 63* 141* 139*  ALKPHOS 55 49 43 46 52  BILITOT 0.3 0.6 0.2* 0.5 0.4  PROT 7.9 7.7 7.2 7.1 7.0  ALBUMIN 3.5 3.3* 3.2* 3.2* 3.3*   No results for input(s): LIPASE, AMYLASE in the last 168 hours. No results for input(s): AMMONIA in the last 168 hours. CBC: Recent Labs  Lab 11/13/18 0300 11/14/18 0226 11/15/18 0237 11/16/18 0433 11/17/18 0249  WBC 8.7 9.2 5.9 7.6 8.8  NEUTROABS 7.3 7.8* 4.5 5.6 6.4  HGB 15.1 13.6 13.5 14.7 14.7  HCT 44.4 41.7 41.2 44.4 45.1  MCV 85.2 86.5 86.6 86.0 85.7  PLT 185 218 256 348 396   Cardiac Enzymes: Recent Labs  Lab 11/13/18 0300  TROPONINI <0.03   BNP: Invalid input(s): POCBNP CBG: No results for input(s): GLUCAP in the last 168 hours. D-Dimer Recent Labs    11/16/18 0433 11/17/18 0249  DDIMER 0.35 0.28   Hgb A1c No results for input(s): HGBA1C in the last 72 hours. Lipid Profile No results for input(s): CHOL, HDL, LDLCALC, TRIG, CHOLHDL, LDLDIRECT in the last 72 hours. Thyroid function studies No  results for input(s): TSH, T4TOTAL, T3FREE, THYROIDAB in the last 72 hours.  Invalid input(s): FREET3 Anemia work  up Recent Labs    11/16/18 0433 11/17/18 0249  FERRITIN 614* 490*   Urinalysis    Component Value Date/Time   COLORURINE YELLOW (A) 07/15/2015 1115   APPEARANCEUR CLEAR (A) 07/15/2015 1115   LABSPEC 1.035 (H) 07/15/2015 1115   PHURINE 5.0 07/15/2015 1115   GLUCOSEU NEGATIVE 07/15/2015 1115   HGBUR NEGATIVE 07/15/2015 1115   [place] 07/15/2015 1115   KETONESUR NEGATIVE 07/15/2015 1115   PROTEINUR 30 (A) 07/15/2015 1115   NITRITE NEGATIVE 07/15/2015 1115   LEUKOCYTESUR NEGATIVE 07/15/2015 1115   Sepsis Labs Invalid input(s): PROCALCITONIN,  WBC,  LACTICIDVEN Microbiology Recent Results (from the past 240 hour(s))  SARS Coronavirus 2 [facility] order, Performed in [place] hospital lab)     Status: Abnormal   Collection Time: 11/12/18  6:01 AM   Specimen: Nasopharyngeal Swab  Result Value Ref Range Status   SARS Coronavirus 2 POSITIVE (A) NEGATIVE Final    Comment: RESULT CALLED TO, READ BACK BY AND VERIFIED WITH:  TERESA CLAPP AT 0732 11/12/2018 SDR (NOTE) If result is NEGATIVE SARS-CoV-2 target nucleic acids are NOT DETECTED. The SARS-CoV-2 RNA is generally detectable in upper and lower  respiratory specimens during the acute phase of infection. The lowest  concentration of SARS-CoV-2 viral copies this assay can detect is 250  copies / mL. A negative result does not preclude SARS-CoV-2 infection  and should not be used as the sole basis for treatment or other  patient management decisions.  A negative result may occur with  improper specimen collection / handling, submission of specimen other  than nasopharyngeal swab, presence of viral mutation(s) within the  areas targeted by this assay, and inadequate number of viral copies  (<250 copies / mL). A negative result must be combined with clinical  observations, patient history, and epidemiological information. If result is POSITIVE SARS-CoV-2 target nucleic acids are DETECTED. T he SARS-CoV-2 RNA is  generally detectable in upper and lower  respiratory specimens during the acute phase of infection.  Positive  results are indicative of active infection with SARS-CoV-2.  Clinical  correlation with patient history and other diagnostic information is  necessary to determine patient infection status.  Positive results do  not rule out bacterial infection or co-infection with other viruses. If result is PRESUMPTIVE POSTIVE SARS-CoV-2 nucleic acids MAY BE PRESENT.   A presumptive positive result was obtained on the submitted specimen  and confirmed on repeat testing.  While 2019 novel coronavirus  (SARS-CoV-2) nucleic acids may be present in the submitted sample  additional confirmatory testing may be necessary for epidemiological  and / or clinical management purposes  to differentiate between  SARS-CoV-2 and other Sarbecovirus currently known to infect humans.  If clinically indicated additional testing with an alternate test  methodology [phone]) is  advised. The SARS-CoV-2 RNA is generally  detectable in upper and lower respiratory specimens during the acute  phase of infection. The expected result is Negative. Fact Sheet for Patients:  StrictlyIdeas.no Fact Sheet for Healthcare Providers: BankingDealers.co.za This test is not yet approved or cleared by the [place] FDA and has been authorized for detection and/or diagnosis of SARS-CoV-2 by FDA under an Emergency Use Authorization (EUA).  This EUA will remain in effect (meaning this test can be used) for the duration of the COVID-19 declaration under Section 564(b)(1) of the Act, 21 U.S.C. section 360bbb-3(b)(1), unless the authorization is  terminated or revoked sooner. Performed at [facility], [address] [place], [place] [address]   Blood Culture (routine x 2)     Status: None   Collection Time: 11/12/18  6:13 AM   Specimen: BLOOD  Result Value Ref Range Status    Specimen Description BLOOD RIGHT [facility]  Final   Special Requests   Final    BOTTLES DRAWN AEROBIC AND ANAEROBIC Blood Culture adequate volume   Culture   Final    NO GROWTH 5 DAYS Performed at [facility], [address] [place], [place] [address]    Report Status 11/17/2018 FINAL  Final  Blood Culture (routine x 2)     Status: None   Collection Time: 11/12/18  6:13 AM   Specimen: BLOOD  Result Value Ref Range Status   Specimen Description BLOOD RIGHT [facility]  Final   Special Requests   Final    BOTTLES DRAWN AEROBIC AND ANAEROBIC Blood Culture adequate volume   Culture   Final    NO GROWTH 5 DAYS Performed at [facility], [address], [place], [place] [address]    Report Status 11/17/2018 FINAL  Final     Time coordinating discharge: 25  minutes      SIGNED:   Alberteen Sam, MD  [facility] 11/17/2018, 6:39 PM

## 2021-03-20 IMAGING — CT CT ANGIOGRAPHY CHEST
1 of 4 series · 17 of 29 positions shown · IV contrast (OMNIPAQUE)
Comparison: Chest radiograph earlier this day.
COMPARISON: Chest radiograph earlier this day.

Addendum:
CLINICAL DATA: PE suspected, intermediate prob, positive D-dimer.
Progressive shortness of breath and chest pain. 6J8AR-7X positive.

EXAM:
CT ANGIOGRAPHY CHEST WITH CONTRAST
TECHNIQUE: Multidetector CT imaging of the chest was performed using the
standard protocol during bolus administration of intravenous
contrast. Multiplanar CT image reconstructions and MIPs were
obtained to evaluate the vascular anatomy.
CONTRAST:  100mL OMNIPAQUE IOHEXOL 350 MG/ML SOLN

[Series 6: pe chest · axial · 0.75mm/px · z∈[+960,+1172]mm · 17 of 118 slices shown]
[im 6/118  lung]
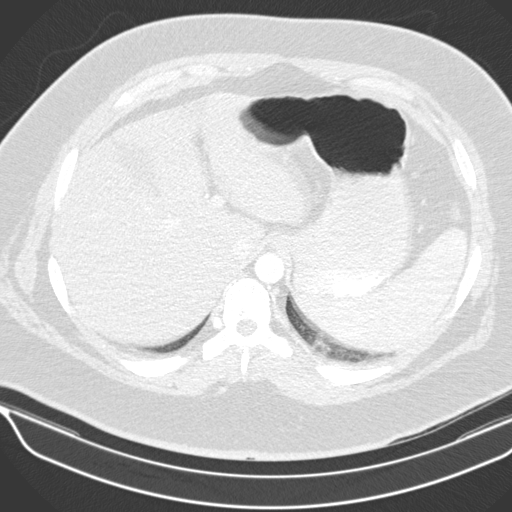
[im 12/118  mediastinal]
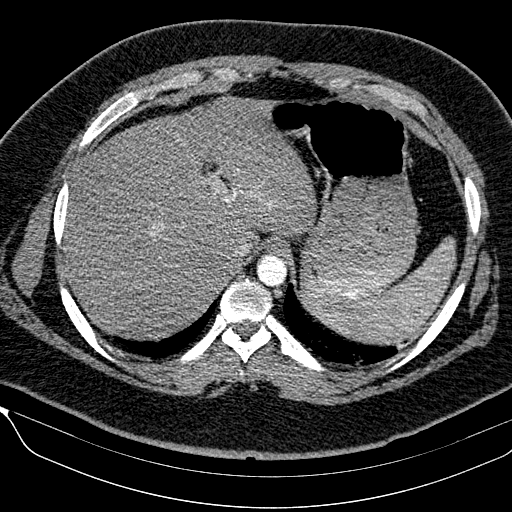
[im 23/118  lung]
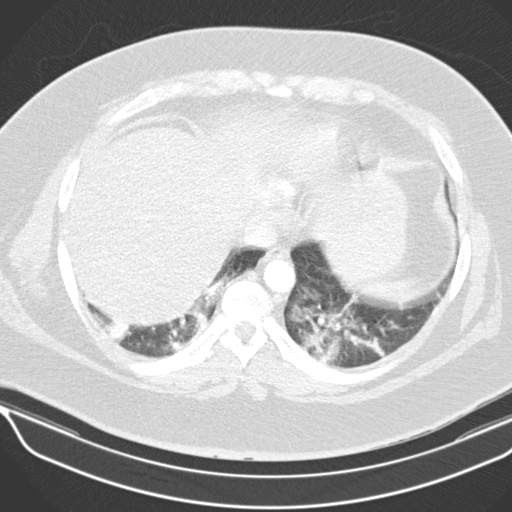
[im 28/118  mediastinal]
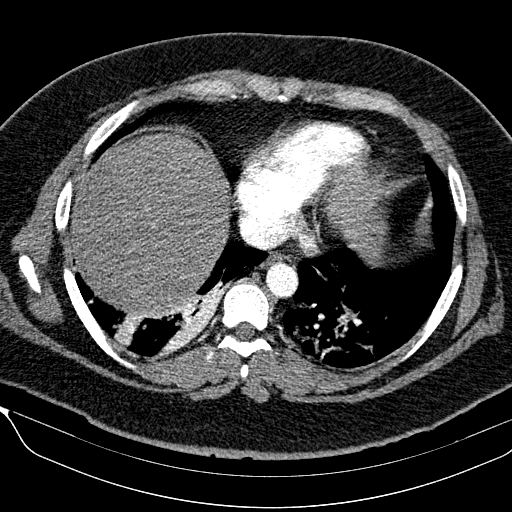
[im 34/118  lung]
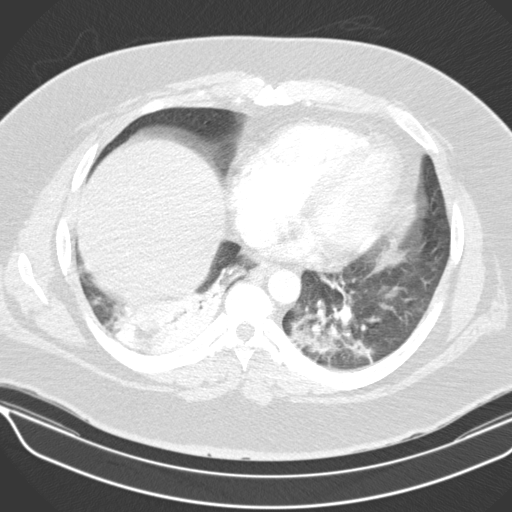
[im 40/118  mediastinal]
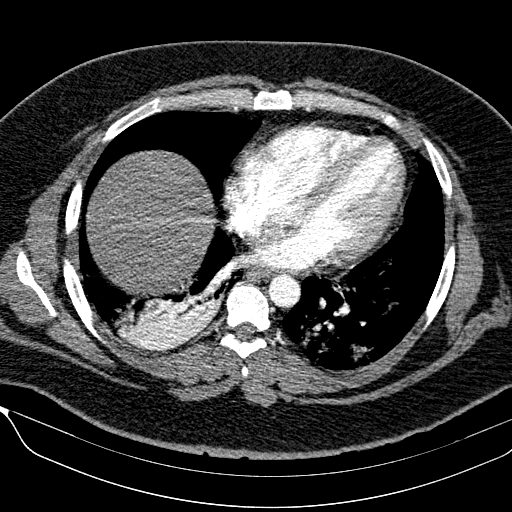
[im 51/118  lung]
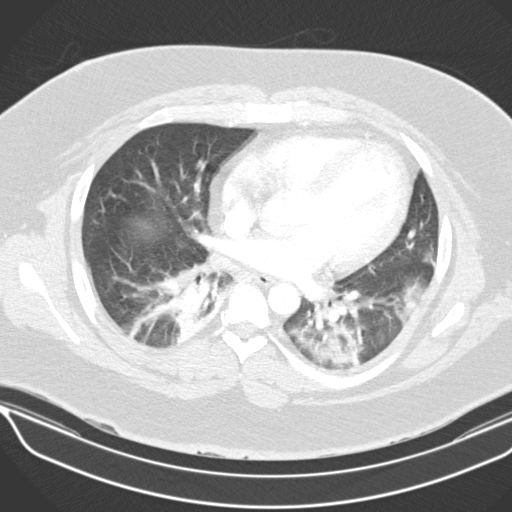
[im 56/118  mediastinal]
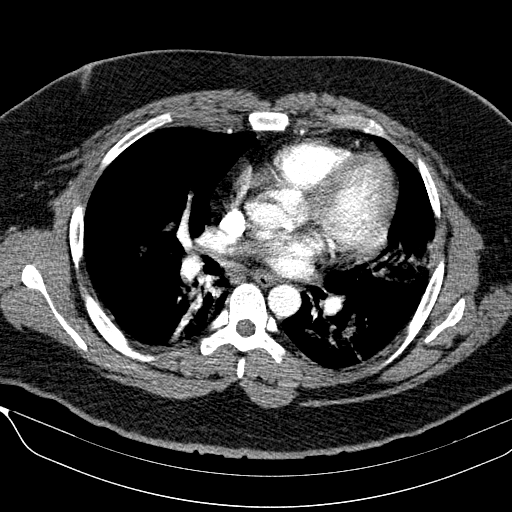
[im 59/118  lung]
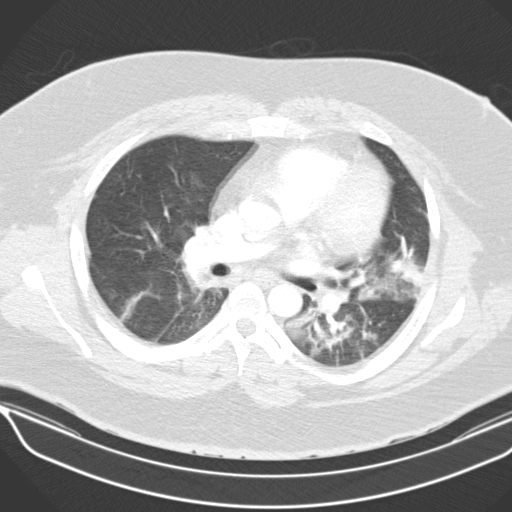
[im 62/118  mediastinal]
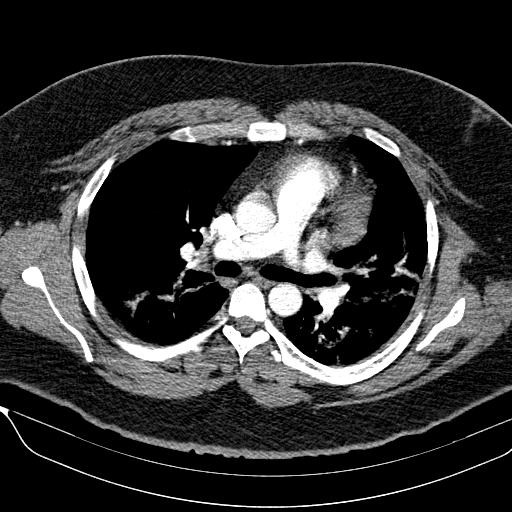
[im 67/118  lung]
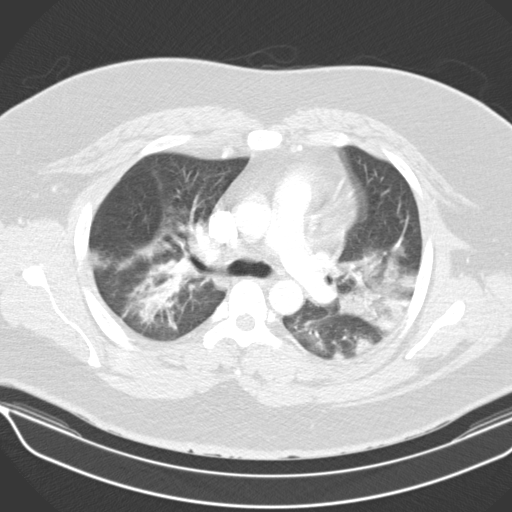
[im 79/118  mediastinal]
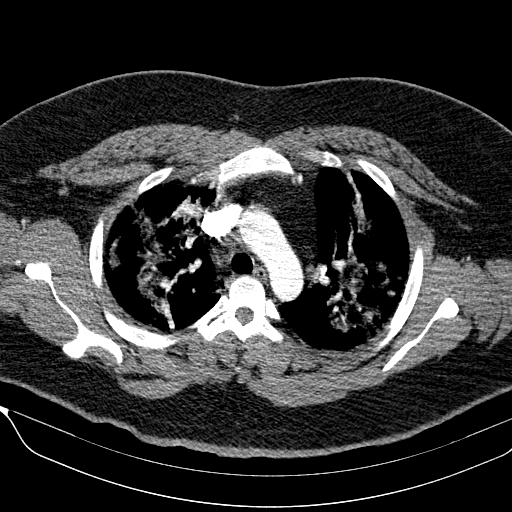
[im 84/118  lung]
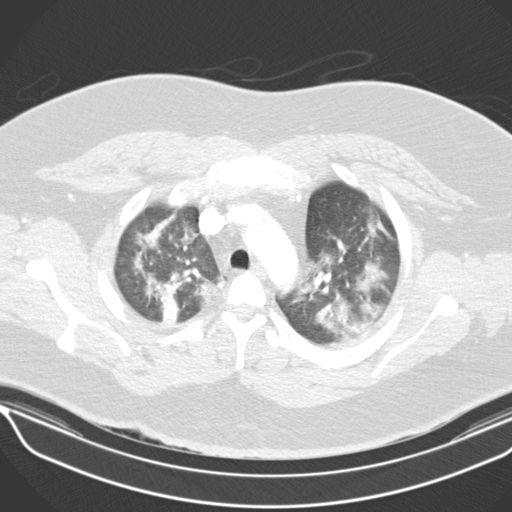
[im 90/118  mediastinal]
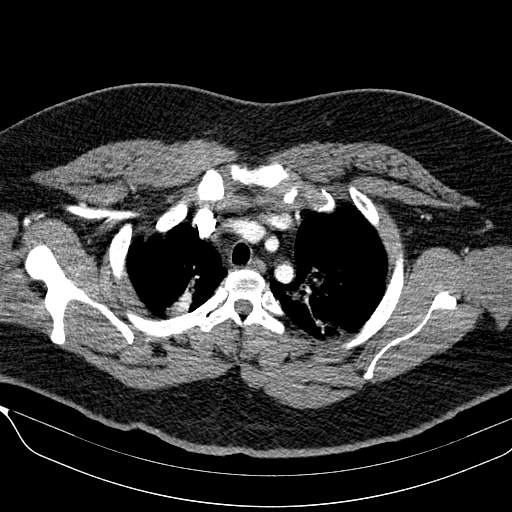
[im 95/118  lung]
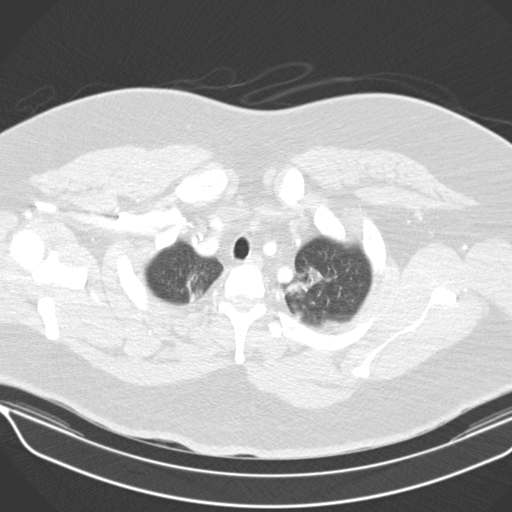
[im 106/118  mediastinal]
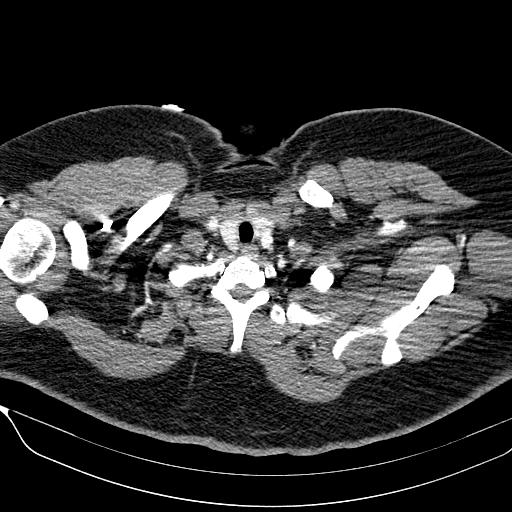
[im 112/118  lung]
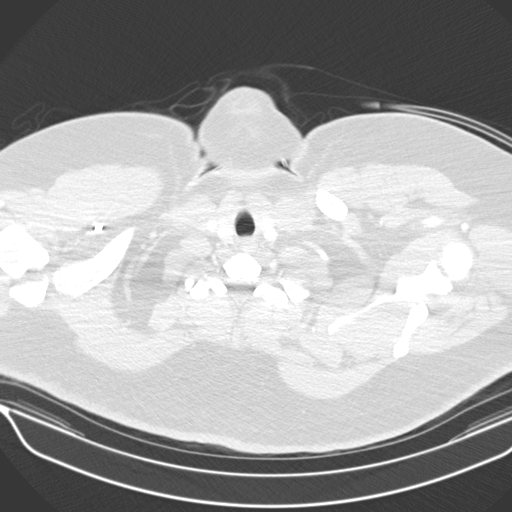

[17 of 29 positions shown; findings below may reference images not displayed]

FINDINGS: Cardiovascular: Examination is technically limited due to soft
tissue attenuation from habitus, breathing motion artifact, contrast
bolus timing. Allowing for this, no filling defects in the pulmonary
arteries to suggest pulmonary embolus. Examination is diagnostic to
the level of the proximal segmental level. Thoracic aorta is normal
in caliber without dissection. Heart is normal in size. No
pericardial effusion.

Mediastinum/Nodes: Small mediastinal nodes not enlarged by size
criteria. The esophagus is decompressed. Thyroid gland is
unremarkable.

Lungs/Pleura: Multifocal pulmonary opacities with ground-glass and
confluent opacities in both lungs. Confluent dependent opacity in
the right lower lobe is most prominent. There is central air
bronchograms. No pleural fluid.

Upper Abdomen: Suspected hepatic steatosis. No acute findings.

Musculoskeletal: There are no acute or suspicious osseous
abnormalities.

Review of the MIP images confirms the above findings.
IMPRESSION: 1. No central pulmonary embolus, branches distal to the segmental
level are not well assessed due to breathing motion, soft tissue
attenuation from habitus and contrast bolus timing.
2. Multifocal pneumonia in a pattern consistent with 6J8AR-7X
infection. Confluent ground-glass opacities throughout both lungs,
most prominent in the right lower lobe.

ADDENDUM:
This addendum is created to disregard this report. This is a
duplicate accession, the report has been removed to accession
[id number].

*** End of Addendum ***
FINDINGS: Cardiovascular: Examination is technically limited due to soft
tissue attenuation from habitus, breathing motion artifact, contrast
bolus timing. Allowing for this, no filling defects in the pulmonary
arteries to suggest pulmonary embolus. Examination is diagnostic to
the level of the proximal segmental level. Thoracic aorta is normal
in caliber without dissection. Heart is normal in size. No
pericardial effusion.

Mediastinum/Nodes: Small mediastinal nodes not enlarged by size
criteria. The esophagus is decompressed. Thyroid gland is
unremarkable.

Lungs/Pleura: Multifocal pulmonary opacities with ground-glass and
confluent opacities in both lungs. Confluent dependent opacity in
the right lower lobe is most prominent. There is central air
bronchograms. No pleural fluid.

Upper Abdomen: Suspected hepatic steatosis. No acute findings.

Musculoskeletal: There are no acute or suspicious osseous
abnormalities.

Review of the MIP images confirms the above findings.
IMPRESSION: 1. No central pulmonary embolus, branches distal to the segmental
level are not well assessed due to breathing motion, soft tissue
attenuation from habitus and contrast bolus timing.
2. Multifocal pneumonia in a pattern consistent with 6J8AR-7X
infection. Confluent ground-glass opacities throughout both lungs,
most prominent in the right lower lobe.

## 2024-03-18 ENCOUNTER — Ambulatory Visit
